# Patient Record
Sex: Female | Born: 1975 | Race: White | Hispanic: No | Marital: Single | Smoking: Never smoker
Health system: Southern US, Community
[De-identification: ages and names within clinical notes are randomized; demographics above are authoritative.]

## PROBLEM LIST (undated history)

## (undated) DIAGNOSIS — R51 Headache: Secondary | ICD-10-CM

## (undated) DIAGNOSIS — R519 Headache, unspecified: Secondary | ICD-10-CM

## (undated) DIAGNOSIS — N83209 Unspecified ovarian cyst, unspecified side: Secondary | ICD-10-CM

## (undated) DIAGNOSIS — E039 Hypothyroidism, unspecified: Secondary | ICD-10-CM

## (undated) DIAGNOSIS — Z9889 Other specified postprocedural states: Secondary | ICD-10-CM

## (undated) DIAGNOSIS — M349 Systemic sclerosis, unspecified: Secondary | ICD-10-CM

## (undated) DIAGNOSIS — Z87442 Personal history of urinary calculi: Secondary | ICD-10-CM

## (undated) HISTORY — DX: Unspecified ovarian cyst, unspecified side: N83.209

## (undated) HISTORY — PX: BREAST BIOPSY: SHX20

## (undated) HISTORY — PX: KIDNEY STONE SURGERY: SHX686

## (undated) HISTORY — PX: DIAGNOSTIC LAPAROSCOPY: SUR761

---

## 2008-09-22 ENCOUNTER — Encounter: Payer: Self-pay | Admitting: Internal Medicine

## 2008-10-12 ENCOUNTER — Encounter: Payer: Self-pay | Admitting: Internal Medicine

## 2008-11-12 ENCOUNTER — Encounter: Payer: Self-pay | Admitting: Internal Medicine

## 2009-09-15 ENCOUNTER — Ambulatory Visit: Payer: Self-pay | Admitting: Internal Medicine

## 2010-01-03 ENCOUNTER — Ambulatory Visit: Payer: Self-pay | Admitting: Internal Medicine

## 2010-10-03 ENCOUNTER — Ambulatory Visit: Payer: Self-pay | Admitting: Internal Medicine

## 2011-11-08 ENCOUNTER — Ambulatory Visit: Payer: Self-pay | Admitting: Internal Medicine

## 2013-02-26 ENCOUNTER — Ambulatory Visit: Payer: Self-pay | Admitting: Internal Medicine

## 2013-02-26 DIAGNOSIS — R0602 Shortness of breath: Secondary | ICD-10-CM

## 2014-11-12 HISTORY — PX: POLYPECTOMY: SHX149

## 2018-09-27 ENCOUNTER — Emergency Department: Payer: BC Managed Care – PPO

## 2018-09-27 ENCOUNTER — Other Ambulatory Visit: Payer: Self-pay

## 2018-09-27 ENCOUNTER — Emergency Department
Admission: EM | Admit: 2018-09-27 | Discharge: 2018-09-27 | Disposition: A | Discharge status: Home / Self Care | Payer: BC Managed Care – PPO | Attending: Emergency Medicine | Admitting: Emergency Medicine

## 2018-09-27 DIAGNOSIS — S92001A Unspecified fracture of right calcaneus, initial encounter for closed fracture: Secondary | ICD-10-CM | POA: Insufficient documentation

## 2018-09-27 DIAGNOSIS — Z23 Encounter for immunization: Secondary | ICD-10-CM | POA: Insufficient documentation

## 2018-09-27 DIAGNOSIS — Y92410 Unspecified street and highway as the place of occurrence of the external cause: Secondary | ICD-10-CM | POA: Diagnosis not present

## 2018-09-27 DIAGNOSIS — Y999 Unspecified external cause status: Secondary | ICD-10-CM | POA: Diagnosis not present

## 2018-09-27 DIAGNOSIS — S51012A Laceration without foreign body of left elbow, initial encounter: Secondary | ICD-10-CM | POA: Diagnosis present

## 2018-09-27 DIAGNOSIS — Y9389 Activity, other specified: Secondary | ICD-10-CM | POA: Insufficient documentation

## 2018-09-27 MED ORDER — OXYCODONE-ACETAMINOPHEN 5-325 MG PO TABS
1.0000 | ORAL_TABLET | Freq: Once | ORAL | Status: AC
  Administered 2018-09-27: 1 via ORAL
  Filled 2018-09-27: qty 1

## 2018-09-27 MED ORDER — FENTANYL CITRATE (PF) 100 MCG/2ML IJ SOLN
50.0000 ug | Freq: Once | INTRAMUSCULAR | Status: AC
  Administered 2018-09-27: 50 ug via INTRAVENOUS

## 2018-09-27 MED ORDER — TETANUS-DIPHTH-ACELL PERTUSSIS 5-2.5-18.5 LF-MCG/0.5 IM SUSP
0.5000 mL | Freq: Once | INTRAMUSCULAR | Status: AC
  Administered 2018-09-27: 0.5 mL via INTRAMUSCULAR
  Filled 2018-09-27: qty 0.5

## 2018-09-27 MED ORDER — FENTANYL CITRATE (PF) 100 MCG/2ML IJ SOLN
INTRAMUSCULAR | Status: AC
  Administered 2018-09-27: 50 ug via INTRAVENOUS
  Filled 2018-09-27: qty 2

## 2018-09-27 MED ORDER — FENTANYL CITRATE (PF) 100 MCG/2ML IJ SOLN: 50.0000 ug | Freq: Once | INTRAMUSCULAR | Status: DC

## 2018-09-27 MED ORDER — LIDOCAINE-EPINEPHRINE 2 %-1:100000 IJ SOLN
INTRAMUSCULAR | Status: AC
  Administered 2018-09-27: 20 mL
  Filled 2018-09-27: qty 1

## 2018-09-27 MED ORDER — LIDOCAINE-EPINEPHRINE 2 %-1:100000 IJ SOLN
20.0000 mL | Freq: Once | INTRAMUSCULAR | Status: AC
  Administered 2018-09-27: 20 mL

## 2018-09-27 MED ORDER — OXYCODONE-ACETAMINOPHEN 5-325 MG PO TABS: 1.0000 | ORAL_TABLET | ORAL | 0 refills | Status: DC | PRN
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
## 2018-09-30 ENCOUNTER — Other Ambulatory Visit: Payer: Self-pay | Admitting: Podiatry

## 2018-09-30 DIAGNOSIS — S92011A Displaced fracture of body of right calcaneus, initial encounter for closed fracture: Secondary | ICD-10-CM

## 2018-10-02 ENCOUNTER — Ambulatory Visit
Admission: RE | Admit: 2018-10-02 | Discharge: 2018-10-02 | Disposition: A | Discharge status: Home / Self Care | Payer: BC Managed Care – PPO | Source: Ambulatory Visit | Attending: Podiatry | Admitting: Podiatry

## 2018-10-02 DIAGNOSIS — X58XXXA Exposure to other specified factors, initial encounter: Secondary | ICD-10-CM | POA: Insufficient documentation

## 2018-10-02 DIAGNOSIS — S92254A Nondisplaced fracture of navicular [scaphoid] of right foot, initial encounter for closed fracture: Secondary | ICD-10-CM | POA: Diagnosis not present

## 2018-10-02 DIAGNOSIS — S92151A Displaced avulsion fracture (chip fracture) of right talus, initial encounter for closed fracture: Secondary | ICD-10-CM | POA: Insufficient documentation

## 2018-10-02 DIAGNOSIS — S92011A Displaced fracture of body of right calcaneus, initial encounter for closed fracture: Secondary | ICD-10-CM | POA: Diagnosis present

## 2018-10-06 ENCOUNTER — Other Ambulatory Visit: Payer: Self-pay | Admitting: Podiatry

## 2018-10-08 ENCOUNTER — Encounter
Admission: RE | Admit: 2018-10-08 | Discharge: 2018-10-08 | Disposition: A | Discharge status: Home / Self Care | Payer: BC Managed Care – PPO | Source: Ambulatory Visit | Attending: Podiatry | Admitting: Podiatry

## 2018-10-08 ENCOUNTER — Other Ambulatory Visit: Payer: Self-pay

## 2018-10-08 HISTORY — DX: Headache: R51

## 2018-10-08 HISTORY — DX: Personal history of urinary calculi: Z87.442

## 2018-10-08 HISTORY — DX: Hypothyroidism, unspecified: E03.9

## 2018-10-08 HISTORY — DX: Headache, unspecified: R51.9

## 2018-10-08 NOTE — Patient Instructions (Addendum)
[facility]: [facility] 2nd floor medical mall [facility](Medical Mall Entrance-take elevator on left to 2nd floor.  Check in with surgery information desk.) @ 6 AM PER PT   Remember: Instructions that are not followed completely may result in serious medical risk, up to and including death, or upon the discretion of your surgeon and anesthesiologist your surgery may need to be rescheduled.    _x___ 1. Do not eat food after midnight the night before your procedure. You may drink clear liquids up to 2 hours before you are scheduled to arrive at the hospital for your procedure.  Do not drink clear liquids within 2 hours of your scheduled arrival to the hospital.  Clear liquids include  --Water or Apple juice without pulp  --Clear carbohydrate beverage such as ClearFast or Gatorade  --Black Coffee or Clear Tea (No milk, no creamers, do not add anything to the coffee or Tea   ____Ensure clear carbohydrate drink on the way to the hospital for bariatric patients  ____Ensure clear carbohydrate drink 3 hours before surgery for Dr Rutherford NailByrnett's patients if physician instructed.   No gum chewing or hard candies.     __x__ 2. No Alcohol for 24 hours before or after surgery.   __x__3. No Smoking or e-cigarettes for 24 prior to surgery.  Do not use any chewable tobacco products for at least 6 hour prior to surgery   ____  4. Bring all medications with you on the day of surgery if instructed.    __x__ 5. Notify your doctor if there is any change in your medical condition     (cold, fever, infections).    x___6. On the morning of surgery brush your teeth with toothpaste and water.  You may rinse your mouth with mouth wash if you wish.  Do not swallow any toothpaste or mouthwash.   Do not wear jewelry, make-up, hairpins, clips or nail polish.  Do not wear lotions, powders, or perfumes. You may wear deodorant.  Do not shave 48 hours prior to surgery. Men may shave  face and neck.  Do not bring valuables to the hospital.    [facility] not responsible for any belongings or valuables.               Contacts, dentures or bridgework may not be worn into surgery.  Leave your suitcase in the car. After surgery it may be brought to your room.  For patients admitted to the hospital, discharge time is determined by your treatment team.  _  Patients discharged the day of surgery will not be allowed to drive home.  You will need someone to drive you home and stay with you the night of your procedure.    Please read over the following fact sheets that you were given:   [facility]    _x___ TAKE THE FOLLOWING MEDICATION THE MORNING OF SURGERY WITH A SMALL SIP OF WATER. These include:  1. LEVOTHYROXINE  2. YOU MAY TAKE PERCOCET DAY OF SURGERY IF NEEDED WITH A SMALL SIP OF WATER  3.  4.  5.  6.  ____Fleets enema or Magnesium Citrate as directed.   ____ Use CHG Soap or sage wipes as directed on instruction sheet   ____ Use inhalers on the day of surgery and bring to hospital day of surgery  ____ Stop Metformin and Janumet 2 days prior to surgery.    ____ Take 1/2 of  usual insulin dose the night before surgery and none on the morning surgery.   ____ Follow recommendations from Cardiologist, Pulmonologist or PCP regarding stopping Aspirin, Coumadin, Plavix ,Eliquis, Effient, or Pradaxa, and Pletal.  X____Stop Anti-inflammatories such as Advil, Aleve, Ibuprofen, Motrin, Naproxen, Naprosyn, Goodies powders or aspirin products NOW-OK to take Tylenol    ____ Stop supplements until after surgery.    ____ Bring C-Pap to the hospital.

## 2018-10-09 MED ORDER — CEFAZOLIN SODIUM-DEXTROSE 2-4 GM/100ML-% IV SOLN
2.0000 g | INTRAVENOUS | Status: AC
  Administered 2018-10-10: 2 g via INTRAVENOUS

## 2018-10-10 ENCOUNTER — Ambulatory Visit: Payer: BC Managed Care – PPO | Admitting: Anesthesiology

## 2018-10-10 ENCOUNTER — Other Ambulatory Visit: Payer: Self-pay

## 2018-10-10 ENCOUNTER — Observation Stay
Admission: RE | Admit: 2018-10-10 | Discharge: 2018-10-11 | Disposition: A | Discharge status: Home / Self Care | Payer: BC Managed Care – PPO | Source: Ambulatory Visit | Attending: Podiatry | Admitting: Podiatry

## 2018-10-10 ENCOUNTER — Encounter: Payer: Self-pay | Admitting: *Deleted

## 2018-10-10 ENCOUNTER — Ambulatory Visit: Payer: BC Managed Care – PPO

## 2018-10-10 ENCOUNTER — Encounter
Admission: RE | Disposition: A | Discharge status: Home / Self Care | Payer: Self-pay | Source: Ambulatory Visit | Attending: Podiatry

## 2018-10-10 DIAGNOSIS — S92001A Unspecified fracture of right calcaneus, initial encounter for closed fracture: Secondary | ICD-10-CM | POA: Diagnosis not present

## 2018-10-10 DIAGNOSIS — Z419 Encounter for procedure for purposes other than remedying health state, unspecified: Secondary | ICD-10-CM

## 2018-10-10 DIAGNOSIS — Z79890 Hormone replacement therapy (postmenopausal): Secondary | ICD-10-CM | POA: Diagnosis not present

## 2018-10-10 DIAGNOSIS — Z885 Allergy status to narcotic agent status: Secondary | ICD-10-CM | POA: Insufficient documentation

## 2018-10-10 DIAGNOSIS — E039 Hypothyroidism, unspecified: Secondary | ICD-10-CM | POA: Diagnosis not present

## 2018-10-10 HISTORY — PX: ORIF CALCANEOUS FRACTURE: SHX5030

## 2018-10-10 LAB — CBC
HCT: 39.4 % (ref 36.0–46.0)
Hemoglobin: 12.9 g/dL (ref 12.0–15.0)
MCH: 28.5 pg (ref 26.0–34.0)
MCHC: 32.7 g/dL (ref 30.0–36.0)
MCV: 87 fL (ref 80.0–100.0)
Platelets: 334 10*3/uL (ref 150–400)
RBC: 4.53 MIL/uL (ref 3.87–5.11)
RDW: 12 % (ref 11.5–15.5)
WBC: 11 10*3/uL — ABNORMAL HIGH (ref 4.0–10.5)
nRBC: 0 % (ref 0.0–0.2)

## 2018-10-10 LAB — CREATININE, SERUM
Creatinine, Ser: 0.68 mg/dL (ref 0.44–1.00)
GFR calc Af Amer: >60 mL/min (ref >60)
GFR calc non Af Amer: >60 mL/min (ref >60)

## 2018-10-10 LAB — POCT PREGNANCY, URINE: Preg Test, Ur: NEGATIVE

## 2018-10-10 SURGERY — OPEN REDUCTION INTERNAL FIXATION (ORIF) CALCANEOUS FRACTURE
Anesthesia: General | Laterality: Right

## 2018-10-10 MED ORDER — FENTANYL CITRATE (PF) 100 MCG/2ML IJ SOLN
INTRAMUSCULAR | Status: AC
  Filled 2018-10-10: qty 2

## 2018-10-10 MED ORDER — CEFAZOLIN SODIUM-DEXTROSE 2-4 GM/100ML-% IV SOLN
INTRAVENOUS | Status: AC
  Filled 2018-10-10: qty 100

## 2018-10-10 MED ORDER — MORPHINE SULFATE (PF) 2 MG/ML IV SOLN: 2.0000 mg | INTRAVENOUS | Status: DC | PRN

## 2018-10-10 MED ORDER — ACETAMINOPHEN 10 MG/ML IV SOLN
INTRAVENOUS | Status: AC
  Filled 2018-10-10: qty 100

## 2018-10-10 MED ORDER — LIDOCAINE HCL (PF) 1 % IJ SOLN
INTRAMUSCULAR | Status: DC | PRN
  Administered 2018-10-10: 1 mL via INTRADERMAL

## 2018-10-10 MED ORDER — MIDAZOLAM HCL 2 MG/2ML IJ SOLN
INTRAMUSCULAR | Status: AC
  Administered 2018-10-10: 2 mg via INTRAVENOUS
  Filled 2018-10-10: qty 2

## 2018-10-10 MED ORDER — OXYCODONE-ACETAMINOPHEN 5-325 MG PO TABS
1.0000 | ORAL_TABLET | Freq: Four times a day (QID) | ORAL | Status: DC | PRN
  Administered 2018-10-10: 2 via ORAL
  Administered 2018-10-10: 1 via ORAL
  Administered 2018-10-11: 2 via ORAL
  Filled 2018-10-10: qty 2
  Filled 2018-10-10: qty 1
  Filled 2018-10-10: qty 2

## 2018-10-10 MED ORDER — BUPIVACAINE HCL (PF) 0.5 % IJ SOLN
INTRAMUSCULAR | Status: AC
  Filled 2018-10-10: qty 30

## 2018-10-10 MED ORDER — ONDANSETRON HCL 4 MG/2ML IJ SOLN
INTRAMUSCULAR | Status: AC
  Filled 2018-10-10: qty 2

## 2018-10-10 MED ORDER — ROPIVACAINE HCL 5 MG/ML IJ SOLN
INTRAMUSCULAR | Status: AC
  Filled 2018-10-10: qty 30

## 2018-10-10 MED ORDER — LIDOCAINE HCL (PF) 1 % IJ SOLN
INTRAMUSCULAR | Status: AC
  Filled 2018-10-10: qty 30

## 2018-10-10 MED ORDER — ONDANSETRON HCL 4 MG/2ML IJ SOLN
INTRAMUSCULAR | Status: DC | PRN
  Administered 2018-10-10: 4 mg via INTRAVENOUS

## 2018-10-10 MED ORDER — OXYCODONE HCL 5 MG PO TABS: 5.0000 mg | ORAL_TABLET | Freq: Once | ORAL | Status: DC | PRN

## 2018-10-10 MED ORDER — DEXAMETHASONE SODIUM PHOSPHATE 10 MG/ML IJ SOLN
INTRAMUSCULAR | Status: AC
  Filled 2018-10-10: qty 1

## 2018-10-10 MED ORDER — FENTANYL CITRATE (PF) 100 MCG/2ML IJ SOLN
50.0000 ug | Freq: Once | INTRAMUSCULAR | Status: AC
  Administered 2018-10-10: 50 ug via INTRAVENOUS

## 2018-10-10 MED ORDER — BUPIVACAINE LIPOSOME 1.3 % IJ SUSP
INTRAMUSCULAR | Status: AC
  Filled 2018-10-10: qty 20

## 2018-10-10 MED ORDER — DEXAMETHASONE SODIUM PHOSPHATE 10 MG/ML IJ SOLN
INTRAMUSCULAR | Status: DC | PRN
  Administered 2018-10-10: 5 mg via INTRAVENOUS

## 2018-10-10 MED ORDER — OXYCODONE-ACETAMINOPHEN 5-325 MG PO TABS: 1.0000 | ORAL_TABLET | Freq: Four times a day (QID) | ORAL | 0 refills | Status: DC | PRN

## 2018-10-10 MED ORDER — FENTANYL CITRATE (PF) 100 MCG/2ML IJ SOLN
INTRAMUSCULAR | Status: DC | PRN
  Administered 2018-10-10: 50 ug via INTRAVENOUS
  Administered 2018-10-10: 100 ug via INTRAVENOUS
  Administered 2018-10-10 (×3): 50 ug via INTRAVENOUS

## 2018-10-10 MED ORDER — ACETAMINOPHEN 10 MG/ML IV SOLN
INTRAVENOUS | Status: DC | PRN
  Administered 2018-10-10: 1000 mg via INTRAVENOUS

## 2018-10-10 MED ORDER — FENTANYL CITRATE (PF) 100 MCG/2ML IJ SOLN
INTRAMUSCULAR | Status: AC
  Administered 2018-10-10: 50 ug via INTRAVENOUS
  Filled 2018-10-10: qty 2

## 2018-10-10 MED ORDER — LIDOCAINE HCL (PF) 1 % IJ SOLN
INTRAMUSCULAR | Status: AC
  Filled 2018-10-10: qty 5

## 2018-10-10 MED ORDER — BUPIVACAINE HCL (PF) 0.25 % IJ SOLN
INTRAMUSCULAR | Status: DC | PRN
  Administered 2018-10-10: 20 mL

## 2018-10-10 MED ORDER — FENTANYL CITRATE (PF) 100 MCG/2ML IJ SOLN
INTRAMUSCULAR | Status: AC
  Administered 2018-10-10: 25 ug via INTRAVENOUS
  Filled 2018-10-10: qty 2

## 2018-10-10 MED ORDER — LACTATED RINGERS IV SOLN
INTRAVENOUS | Status: DC
  Administered 2018-10-10 (×2): via INTRAVENOUS

## 2018-10-10 MED ORDER — BUPIVACAINE HCL 0.25 % IJ SOLN
INTRAMUSCULAR | Status: DC | PRN
  Administered 2018-10-10: 15 mL

## 2018-10-10 MED ORDER — PHENYLEPHRINE HCL 10 MG/ML IJ SOLN
INTRAMUSCULAR | Status: AC
  Filled 2018-10-10: qty 1

## 2018-10-10 MED ORDER — BUPIVACAINE HCL (PF) 0.25 % IJ SOLN
INTRAMUSCULAR | Status: AC
  Filled 2018-10-10: qty 30

## 2018-10-10 MED ORDER — LIDOCAINE HCL (PF) 2 % IJ SOLN
INTRAMUSCULAR | Status: AC
  Filled 2018-10-10: qty 10

## 2018-10-10 MED ORDER — PROPOFOL 10 MG/ML IV BOLUS
INTRAVENOUS | Status: AC
  Filled 2018-10-10: qty 40

## 2018-10-10 MED ORDER — KETOROLAC TROMETHAMINE 30 MG/ML IJ SOLN
INTRAMUSCULAR | Status: AC
  Filled 2018-10-10: qty 1

## 2018-10-10 MED ORDER — MIDAZOLAM HCL 2 MG/2ML IJ SOLN
2.0000 mg | Freq: Once | INTRAMUSCULAR | Status: AC
  Administered 2018-10-10: 2 mg via INTRAVENOUS

## 2018-10-10 MED ORDER — OXYCODONE HCL 5 MG/5ML PO SOLN: 5.0000 mg | Freq: Once | ORAL | Status: DC | PRN

## 2018-10-10 MED ORDER — ENOXAPARIN SODIUM 40 MG/0.4ML ~~LOC~~ SOLN
40.0000 mg | SUBCUTANEOUS | Status: DC
  Administered 2018-10-10: 40 mg via SUBCUTANEOUS
  Filled 2018-10-10: qty 0.4

## 2018-10-10 MED ORDER — ROCURONIUM BROMIDE 100 MG/10ML IV SOLN
INTRAVENOUS | Status: DC | PRN
  Administered 2018-10-10: 50 mg via INTRAVENOUS

## 2018-10-10 MED ORDER — ONDANSETRON HCL 4 MG/2ML IJ SOLN: 4.0000 mg | Freq: Four times a day (QID) | INTRAMUSCULAR | Status: DC | PRN

## 2018-10-10 MED ORDER — SUGAMMADEX SODIUM 200 MG/2ML IV SOLN
INTRAVENOUS | Status: DC | PRN
  Administered 2018-10-10: 200 mg via INTRAVENOUS

## 2018-10-10 MED ORDER — NEOMYCIN-POLYMYXIN B GU 40-200000 IR SOLN
Status: DC | PRN
  Administered 2018-10-10: 6 mL

## 2018-10-10 MED ORDER — BUPIVACAINE LIPOSOME 1.3 % IJ SUSP
INTRAMUSCULAR | Status: DC | PRN
  Administered 2018-10-10: 15 mL

## 2018-10-10 MED ORDER — FAMOTIDINE 20 MG PO TABS
ORAL_TABLET | ORAL | Status: AC
  Administered 2018-10-10: 20 mg via ORAL
  Filled 2018-10-10: qty 1

## 2018-10-10 MED ORDER — LEVOTHYROXINE SODIUM 50 MCG PO TABS
50.0000 ug | ORAL_TABLET | Freq: Every day | ORAL | Status: DC
  Administered 2018-10-11: 50 ug via ORAL
  Filled 2018-10-10: qty 1

## 2018-10-10 MED ORDER — ROPIVACAINE HCL 5 MG/ML IJ SOLN
INTRAMUSCULAR | Status: DC | PRN
  Administered 2018-10-10: 10 mL via PERINEURAL
  Administered 2018-10-10: 20 mL via PERINEURAL

## 2018-10-10 MED ORDER — FENTANYL CITRATE (PF) 100 MCG/2ML IJ SOLN
25.0000 ug | INTRAMUSCULAR | Status: DC | PRN
  Administered 2018-10-10 (×3): 25 ug via INTRAVENOUS

## 2018-10-10 MED ORDER — PROPOFOL 10 MG/ML IV BOLUS
INTRAVENOUS | Status: DC | PRN
  Administered 2018-10-10: 160 mg via INTRAVENOUS

## 2018-10-10 MED ORDER — FAMOTIDINE 20 MG PO TABS
20.0000 mg | ORAL_TABLET | Freq: Once | ORAL | Status: AC
  Administered 2018-10-10: 20 mg via ORAL

## 2018-10-10 MED ORDER — MIDAZOLAM HCL 2 MG/2ML IJ SOLN
INTRAMUSCULAR | Status: AC
  Filled 2018-10-10: qty 2

## 2018-10-10 MED ORDER — SODIUM CHLORIDE (PF) 0.9 % IJ SOLN
INTRAMUSCULAR | Status: AC
  Filled 2018-10-10: qty 10

## 2018-10-10 MED ORDER — BUPIVACAINE HCL (PF) 0.5 % IJ SOLN
INTRAMUSCULAR | Status: AC
  Filled 2018-10-10: qty 10

## 2018-10-10 MED ORDER — POVIDONE-IODINE 7.5 % EX SOLN: Freq: Once | CUTANEOUS | Status: DC

## 2018-10-10 MED ORDER — ONDANSETRON HCL 4 MG PO TABS: 4.0000 mg | ORAL_TABLET | Freq: Four times a day (QID) | ORAL | Status: DC | PRN

## 2018-10-10 MED ORDER — LIDOCAINE HCL (CARDIAC) PF 100 MG/5ML IV SOSY
PREFILLED_SYRINGE | INTRAVENOUS | Status: DC | PRN
  Administered 2018-10-10: 100 mg via INTRAVENOUS

## 2018-10-10 MED ORDER — NEOMYCIN-POLYMYXIN B GU 40-200000 IR SOLN
Status: AC
  Filled 2018-10-10: qty 1

## 2018-10-10 SURGICAL SUPPLY — 54 items
BANDAGE ELASTIC 4 LF NS (GAUZE/BANDAGES/DRESSINGS) ×2 IMPLANT
BIT DRILL 2.5X2.75 QC CALB (BIT) ×2 IMPLANT
BIT DRILL 2.9 CANN QC NONSTRL (BIT) ×2 IMPLANT
BNDG COHESIVE 4X5 TAN STRL (GAUZE/BANDAGES/DRESSINGS) ×4 IMPLANT
BNDG CONFORM 2 STRL LF (GAUZE/BANDAGES/DRESSINGS) ×2 IMPLANT
BNDG CONFORM 3 STRL LF (GAUZE/BANDAGES/DRESSINGS) ×2 IMPLANT
BNDG ESMARK 4X12 TAN STRL LF (GAUZE/BANDAGES/DRESSINGS) ×2 IMPLANT
BNDG GAUZE 4.5X4.1 6PLY STRL (MISCELLANEOUS) ×2 IMPLANT
BONE CANC CHIPS 40CC CAN1/2 (Bone Implant) ×2 IMPLANT
CANISTER SUCT 1200ML W/VALVE (MISCELLANEOUS) ×2 IMPLANT
CHIPS CANC BONE 40CC CAN1/2 (Bone Implant) ×1 IMPLANT
COVER WAND RF STERILE (DRAPES) ×2 IMPLANT
CUFF TOURN 30 STER DUAL PORT (MISCELLANEOUS) ×2 IMPLANT
DRAPE C-ARM XRAY 36X54 (DRAPES) ×2 IMPLANT
DURAPREP 26ML APPLICATOR (WOUND CARE) ×2 IMPLANT
ELECT REM PT RETURN 9FT ADLT (ELECTROSURGICAL) ×2
ELECTRODE REM PT RTRN 9FT ADLT (ELECTROSURGICAL) ×1 IMPLANT
GAUZE PETRO XEROFOAM 1X8 (MISCELLANEOUS) ×4 IMPLANT
GAUZE SPONGE 4X4 12PLY STRL (GAUZE/BANDAGES/DRESSINGS) ×2 IMPLANT
GLOVE BIO SURGEON STRL SZ7.5 (GLOVE) ×2 IMPLANT
GLOVE INDICATOR 8.0 STRL GRN (GLOVE) ×2 IMPLANT
GOWN STRL REUS W/ TWL LRG LVL3 (GOWN DISPOSABLE) ×2 IMPLANT
GOWN STRL REUS W/TWL LRG LVL3 (GOWN DISPOSABLE) ×2
K-WIRE ACE 1.6X6 (WIRE) ×2
KIT TURNOVER KIT A (KITS) ×2 IMPLANT
KWIRE ACE 1.6X6 (WIRE) ×1 IMPLANT
NEEDLE FILTER BLUNT 18X 1/2SAF (NEEDLE) ×1
NEEDLE FILTER BLUNT 18X1 1/2 (NEEDLE) ×1 IMPLANT
NEEDLE HYPO 25X1 1.5 SAFETY (NEEDLE) ×4 IMPLANT
NS IRRIG 500ML POUR BTL (IV SOLUTION) ×2 IMPLANT
PACK EXTREMITY ARMC (MISCELLANEOUS) ×2 IMPLANT
PENCIL ELECTRO HAND CTR (MISCELLANEOUS) ×2 IMPLANT
PLATE ACE PERIMETER (Plate) ×2 IMPLANT
PUTTY DBX 1CC (Putty) ×2 IMPLANT
PUTTY DBX 1CC DEPUY (Putty) ×1 IMPLANT
SCREW ACE CAN 4.0 34M (Screw) ×2 IMPLANT
SCREW CANN ACE 4.0X32 (Screw) ×2 IMPLANT
SCREW CORTICAL 3.5MM  28MM (Screw) ×2 IMPLANT
SCREW CORTICAL 3.5MM  34MM (Screw) ×2 IMPLANT
SCREW CORTICAL 3.5MM 26MM (Screw) ×2 IMPLANT
SCREW CORTICAL 3.5MM 28MM (Screw) ×2 IMPLANT
SCREW CORTICAL 3.5MM 34MM (Screw) ×2 IMPLANT
SPLINT CAST 1 STEP 4X30 (MISCELLANEOUS) ×2 IMPLANT
SPLINT FAST PLASTER 5X30 (CAST SUPPLIES) ×1
SPLINT PLASTER CAST FAST 5X30 (CAST SUPPLIES) ×1 IMPLANT
STOCKINETTE M/LG 89821 (MISCELLANEOUS) ×2 IMPLANT
STRIP CLOSURE SKIN 1/4X4 (GAUZE/BANDAGES/DRESSINGS) ×2 IMPLANT
SUT ETHILON 3-0 FS-10 30 BLK (SUTURE) ×2
SUT VIC AB 2-0 CT1 27 (SUTURE) ×2
SUT VIC AB 2-0 CT1 TAPERPNT 27 (SUTURE) ×2 IMPLANT
SUT VIC AB 4-0 FS2 27 (SUTURE) ×2 IMPLANT
SUT VICRYL AB 3-0 FS1 BRD 27IN (SUTURE) ×2 IMPLANT
SUTURE EHLN 3-0 FS-10 30 BLK (SUTURE) ×1 IMPLANT
SYR 10ML LL (SYRINGE) ×6 IMPLANT

## 2018-10-10 NOTE — Progress Notes (Signed)
Pt AOx4. Resting quietly in the room. Pt denies pain at this time. Ice pack on ankle and elevated on pillow. IV Saline locked. Pt on room air. No other complaints at this time.

## 2018-10-10 NOTE — Anesthesia Postprocedure Evaluation (Signed)
Anesthesia Post Note  Patient: Dana Figueroa  Procedure(s) Performed: OPEN REDUCTION INTERNAL FIXATION (ORIF) CALCANEOUS FRACTURE (Right )  Patient location during evaluation: PACU Anesthesia Type: General Level of consciousness: awake and alert Pain management: pain level controlled Vital Signs Assessment: post-procedure vital signs reviewed and stable Respiratory status: spontaneous breathing, nonlabored ventilation, respiratory function stable and patient connected to nasal cannula oxygen Cardiovascular status: blood pressure returned to baseline and stable Postop Assessment: no apparent nausea or vomiting Anesthetic complications: no     Last Vitals:  Vitals:   10/10/18 1227 10/10/18 1438  BP: (!) 145/91 (!) 134/91  Pulse: 95 97  Resp: 17   Temp:    SpO2: 98% 98%    Last Pain:  Vitals:   10/10/18 1228  TempSrc:   PainSc: 0-No pain                 Cleda MccreedyJoseph K Tamiki Kuba

## 2018-10-10 NOTE — Progress Notes (Signed)
Pt awoke in severe pain in PACU.  Will keep in observation overnight.  May add PCA.  Having popliteal block in pacu now.

## 2018-10-10 NOTE — Anesthesia Procedure Notes (Addendum)
Anesthesia Regional Block: Popliteal block   Pre-Anesthetic Checklist: ,, timeout performed, Correct Patient, Correct Site, Correct Laterality, Correct Procedure, Correct Position, site marked, Risks and benefits discussed,  Surgical consent,  Pre-op evaluation,  At surgeon's request and post-op pain management  Laterality: Lower and Right  Prep: chloraprep       Needles:  Injection technique: Single-shot  Needle Type: Echogenic Needle     Needle Length: 9cm  Needle Gauge: 21     Additional Needles:   Procedures:,,,, ultrasound used (permanent image in chart),,,,  Narrative:  Start time: 10/10/2018 7:15 AM End time: 10/10/2018 7:21 AM Injection made incrementally with aspirations every 5 mL.  Performed by: Personally  Anesthesiologist: Piscitello, Cleda MccreedyJoseph K, MD  Additional Notes: Patient consented for risk of nerve block including but not limited to permament nerve injury, damage to surrounding structures or failed block.  Patient voiced understanding.   Functioning IV was confirmed and monitors were applied.  A echogenic needle was used. Sterile prep,hand hygiene and sterile gloves were used. Minimal sedation used for procedure.   No paresthesia endorsed by patient during the procedure.  Negative aspiration and negative test dose prior to incremental administration of local anesthetic. The patient tolerated the procedure well with no immediate complications.

## 2018-10-10 NOTE — Anesthesia Procedure Notes (Signed)
Procedure Name: Intubation Date/Time: 10/10/2018 7:52 AM Performed by: Clyde Lundborgisser, Oluwadamilola Deliz L, CRNA Pre-anesthesia Checklist: Patient identified, Emergency Drugs available, Suction available and Patient being monitored Patient Re-evaluated:Patient Re-evaluated prior to induction Oxygen Delivery Method: Circle system utilized Preoxygenation: Pre-oxygenation with 100% oxygen Induction Type: IV induction Ventilation: Mask ventilation without difficulty Laryngoscope Size: McGraph and 3 Grade View: Grade II Tube type: Oral Tube size: 7.0 mm Number of attempts: 2 Airway Equipment and Method: Patient positioned with wedge pillow and Stylet Placement Confirmation: ETT inserted through vocal cords under direct vision,  positive ETCO2,  CO2 detector and breath sounds checked- equal and bilateral Secured at: 20 cm Tube secured with: Tape Dental Injury: Teeth and Oropharynx as per pre-operative assessment

## 2018-10-10 NOTE — Anesthesia Procedure Notes (Signed)
Anesthesia Regional Block: Popliteal block   Pre-Anesthetic Checklist: ,, timeout performed, Correct Patient, Correct Site, Correct Laterality, Correct Procedure, Correct Position, site marked, Risks and benefits discussed,  Surgical consent,  Pre-op evaluation,  At surgeon's request and post-op pain management  Laterality: Lower and Right  Prep: chloraprep       Needles:  Injection technique: Single-shot  Needle Type: Echogenic Needle     Needle Length: 9cm  Needle Gauge: 21     Additional Needles:   Procedures:,,,, ultrasound used (permanent image in chart),,,,  Narrative:  Start time: 10/10/2018 11:10 AM End time: 10/10/2018 11:14 AM Injection made incrementally with aspirations every 5 mL.  Performed by: Personally  Anesthesiologist: Piscitello, Cleda MccreedyJoseph K, MD  Additional Notes: Patient endorsing posterior foot pain and requests re block  Functioning IV was confirmed and monitors were applied.  A echogenic needle was used. Sterile prep,hand hygiene and sterile gloves were used. Minimal sedation used for procedure.   No paresthesia endorsed by patient during the procedure.  Negative aspiration and negative test dose prior to incremental administration of local anesthetic. The patient tolerated the procedure well with no immediate complications.

## 2018-10-10 NOTE — Care Management Note (Signed)
Case Management Note  Patient Details  Name: Dana Figueroa MRN: [id number] Date of Birth: [birth year]  Subjective/Objective:                  RNCM spoke with patient regarding transition of care. She has a knee scooter, a wheelchair and crutches available for use at home.  Her parents will rotate assisting her in the home.  She does not not want a bedside commode for home.     Action/Plan: RNCM to follow for discharge needs.   Expected Discharge Date:  10/10/18               Expected Discharge Plan:     In-House Referral:     Discharge planning Services  CM Consult  Post Acute Care Choice:    Choice offered to:     DME Arranged:    DME Agency:     HH Arranged:    HH Agency:     Status of Service:  In process, will continue to follow  If discussed at Long Length of Stay Meetings, dates discussed:    Additional Comments:  Collie Siadngela Arbor Leer, RN 10/10/2018, 3:59 PM

## 2018-10-10 NOTE — Transfer of Care (Signed)
Immediate Anesthesia Transfer of Care Note  Patient: Dana Figueroa  Procedure(s) Performed: OPEN REDUCTION INTERNAL FIXATION (ORIF) CALCANEOUS FRACTURE (Right )  Patient Location: PACU  Anesthesia Type:General  Level of Consciousness: awake, alert  and oriented  Airway & Oxygen Therapy: Patient Spontanous Breathing and Patient connected to face mask oxygen  Post-op Assessment: Report given to RN and Post -op Vital signs reviewed and stable  Post vital signs: Reviewed and stable  Last Vitals:  Vitals Value Taken Time  BP    Temp    Pulse    Resp    SpO2      Last Pain:  Vitals:   10/10/18 0730  TempSrc:   PainSc: 0-No pain         Complications: No apparent anesthesia complications

## 2018-10-10 NOTE — Op Note (Signed)
Operative note   Surgeon:Sybol Morre [employer] forces logistics/support/administrative officer: None    Preop diagnosis: Right joint depression calcaneal fracture    Postop diagnosis: Same    Procedure: ORIF joint depression calcaneal fracture    EBL: Minimal    Anesthesia:regional and general.  Patient underwent preoperative popliteal block    Hemostasis: Thigh tourniquet inflated 250 mmHg for approximately 115 minutes    Specimen: None    Complications: None    Operative indications:Dana Figueroa is an 42 y.o. that presents today for surgical intervention.  The risks/benefits/alternatives/complications have been discussed and consent has been given.    Procedure:  Patient was brought into the OR and placed on the operating table in the lateral decubitus position. After anesthesia was obtained theright lower extremity was prepped and draped in usual sterile fashion.  Attention was directed to the lateral aspect of the calcaneus where after inflation of the tourniquet a lateral extensile incision was placed on the lateral aspect of the calcaneus.  Full-thickness incision was taken down to bone.  Full-thickness flap was then created and the flap was taken up to the level of the subtalar joint.  3 K wires were used for no touch retraction of the skin.  At this time the joint depression calcaneal fracture was noted.  The lateral calcaneal wall was lateral to its normal position.  There was noted to be a large fracture fragment of the posterior facet laterally that had impacted into the calcaneus.  With manipulation I was able to peel this away and this was actually set on the back table for later replacement in its normal position.  At this time further inspection of the subtalar joint revealed a second large posterior facet fracture fragment that had embedded into the calcaneus.  I was able to manipulate this and decompress the calcaneus with the use of periosteal bone elevator.  A lamina spreader was placed into the calcaneus  and talus to continue to disimpact the area and I was able to then realigned this larger more medial posterior facet fragment piece.  At this time this was all stabilized with multiple K wires.  Good realignment of the calcaneus on the axial and lateral view was noted.  The lateral calcaneal wall blowout with the posterior facet piece was then placed back into an anatomically aligned position.  2 K wires were placed from lateral to medial into the calcaneus just inferior to the subtalar joint.  Once again good realignment was noted and at this time two 4.0 mm cannulated screws were placed from lateral to medial to stabilize the posterior facet site.  The lateral calcaneal wall was then placed back into a better anatomic position.  There was noted to be a large defect in the calcaneus from the impaction.  Next a permanent plate from the [employer] was placed on the lateral aspect of the calcaneus.  Multiple screw holes were filled with 3.5 mm solid cortical screws.  Multiple views of fluoroscopy revealed good realignment of the calcaneus.  No residual varus or valgus of the calcaneus on the axial view.  Boehler's angle was reestablished nicely.  The wound was flushed once again with copious amounts of irrigation.  The calcaneal defect was then packed with cancellus bone chips and filled with 1 cc of DBX putty.  The lateral skin flap was then placed back on the calcaneus with the use of 2-0 and 3-0 Vicryl and the skin was reapproximated with 3-0 nylon.  The area was then  infiltrated with a total of 30 cc of a one-to-one mixture of 0.25% bupivacaine and Exparel long-acting anesthetic.  A bulky sterile dressing was applied and patient was placed in a posterior splint with the foot in a neutral position.     Patient tolerated the procedure and anesthesia well.  Was transported from the OR to the PACU with all vital signs stable and vascular status intact. To be discharged per routine protocol.  Will follow up in  approximately 1 week in the outpatient clinic.

## 2018-10-10 NOTE — H&P (Signed)
HISTORY AND PHYSICAL INTERVAL NOTE:  10/10/2018  7:19 AM  Dana Figueroa  has presented today for surgery, with the diagnosis of Right heel fracture.  The various methods of treatment have been discussed with the patient.  No guarantees were given.  After consideration of risks, benefits and other options for treatment, the patient has consented to surgery.  I have reviewed the patients' chart and labs.     A history and physical examination was performed in my office.  The patient was reexamined.  There have been no changes to this history and physical examination.  Dana Figueroa, Dana Figueroa A

## 2018-10-10 NOTE — Anesthesia Post-op Follow-up Note (Signed)
Anesthesia QCDR form completed.        

## 2018-10-10 NOTE — Anesthesia Preprocedure Evaluation (Signed)
Anesthesia Evaluation  Patient identified by MRN, date of birth, ID band Patient awake    Reviewed: Allergy & Precautions, H&P , NPO status , Patient's Chart, lab work & pertinent test results  History of Anesthesia Complications Negative for: history of anesthetic complications  Airway Mallampati: III  TM Distance: <3 FB Neck ROM: full    Dental  (+) Chipped   Pulmonary neg pulmonary ROS, neg shortness of breath,           Cardiovascular Exercise Tolerance: Good (-) angina(-) Past MI and (-) DOE negative cardio ROS       Neuro/Psych  Headaches, negative psych ROS   GI/Hepatic negative GI ROS, Neg liver ROS, neg GERD  ,  Endo/Other  negative endocrine ROSHypothyroidism   Renal/GU      Musculoskeletal   Abdominal   Peds  Hematology negative hematology ROS (+)   Anesthesia Other Findings Past Medical History: No date: Headache     Comment:  migraines No date: History of kidney stones No date: Hypothyroidism  Past Surgical History: No date: DIAGNOSTIC LAPAROSCOPY No date: KIDNEY STONE SURGERY  BMI    Body Mass Index:  32.94 kg/m      Reproductive/Obstetrics negative OB ROS                             Anesthesia Physical Anesthesia Plan  ASA: III  Anesthesia Plan: General ETT   Post-op Pain Management: GA combined w/ Regional for post-op pain   Induction: Intravenous  PONV Risk Score and Plan: Ondansetron, Dexamethasone, Midazolam and Treatment may vary due to age or medical condition  Airway Management Planned: Oral ETT  Additional Equipment:   Intra-op Plan:   Post-operative Plan: Extubation in OR  Informed Consent: I have reviewed the patients History and Physical, chart, labs and discussed the procedure including the risks, benefits and alternatives for the proposed anesthesia with the patient or authorized representative who has indicated his/her understanding  and acceptance.   Dental Advisory Given  Plan Discussed with: Anesthesiologist, CRNA and Surgeon  Anesthesia Plan Comments: (Patient consented for risks of anesthesia including but not limited to:  - adverse reactions to medications - damage to teeth, lips or other oral mucosa - sore throat or hoarseness - Damage to heart, brain, lungs or loss of life  Patient voiced understanding.)        Anesthesia Quick Evaluation

## 2018-10-11 ENCOUNTER — Encounter: Payer: Self-pay | Admitting: Podiatry

## 2018-10-11 DIAGNOSIS — S92001A Unspecified fracture of right calcaneus, initial encounter for closed fracture: Secondary | ICD-10-CM | POA: Diagnosis not present

## 2018-10-11 LAB — HIV ANTIBODY (ROUTINE TESTING W REFLEX): HIV Screen 4th Generation wRfx: NONREACTIVE

## 2018-10-11 MED ORDER — ASPIRIN EC 81 MG PO TBEC: 81.0000 mg | DELAYED_RELEASE_TABLET | Freq: Every day | ORAL | Status: DC

## 2018-10-11 NOTE — Progress Notes (Signed)
S/p calcaneal fx ORIF. Doing very well.  On po pain meds and comfortable. Dressing with bloody drainage.  Changed and incision looks good. OK to d/c home. NWB left foot. F/U has already been made.

## 2018-10-11 NOTE — Progress Notes (Signed)
Patient a&o, resting in bed. Up with PT this am. Call bell in reach. No complaints at this time. Dressing dry and intact. Plan for discharge today pending orders.

## 2018-10-11 NOTE — Discharge Instructions (Signed)
[facility]  POST OPERATIVE INSTRUCTIONS FOR DR. TROXLER AND DR. Genev[facility] PODIATRY DEPARTMENT   1. Take your medication as prescribed.  Pain medication should be taken only as needed.  2. Keep the dressing clean, dry and intact.  3. Keep your foot elevated above the heart level for the first 48 hours.  4. Walking to the bathroom and brief periods of walking are acceptable, unless we have instructed you to be non-weight bearing.  5. Always wear your post-op shoe when walking.  Always use your crutches if you are to be non-weight bearing.  6. Do not take a shower. Baths are permissible as long as the foot is kept out of the water.   7. Every hour you are awake:  - Bend your knee 15 times. - Flex foot 15 times - Massage calf 15 times  8. Call [facility] [phone]) if any of the following problems occur: - You develop a temperature or fever. - The bandage becomes saturated with blood. - Medication does not stop your pain. - Injury of the foot occurs. - Any symptoms of infection including redness, odor, or red streaks running from wound.   Incentive Spirometer An incentive spirometer is a tool that measures how well you are filling your lungs with each breath. This tool can help keep your lungs clear and active. Taking long, deep breaths may help reverse or decrease the chance of developing breathing (pulmonary) problems, especially infection, following:  Surgery of the chest or abdomen.  Surgery if you have a history of smoking or a lung problem.  A long period of time when you are unable to move or be active.  If the spirometer includes an indicator to show your best effort, your health care provider or respiratory therapist will help you set a goal. Keep a log of your progress if directed by your health care provider. What are the risks?  Breathing too quickly may cause dizziness or cause you to pass out. Take  your time so you do not get dizzy or lightheaded.  If you are in pain, you may need to take or ask for pain medicine before doing incentive spirometry. It is harder to take a deep breath if you are having pain. How to use your incentive spirometer 1. Sit on the edge of your bed if possible, or sit up as far as you can in bed or on a chair. 2. Hold the incentive spirometer in an upright position. 3. Breathe out normally. 4. Place the mouthpiece in your mouth and seal your lips tightly around it. 5. Breathe in slowly and as deeply as possible, raising the piston or the ball toward the top of the column. 6. Hold your breath for 3-5 seconds or for as long as possible. Allow the piston or ball to fall to the bottom of the column. 7. Remove the mouthpiece from your mouth and breathe out normally. 8. The spirometer may include an indicator to show your best effort. Use the indicator as a goal to work toward during each repetition. 9. Rest for a few seconds and repeat this at least 10 times, every 1-2 hours when you are awake. Take your time and take a few normal breaths between deep breaths. Breathing too quickly may cause dizziness or cause you to pass out. Take your time so you do not get dizzy or lightheaded. 10. After each set of 10 deep breaths, practice coughing to be sure your lungs  are clear. If you had a surgical cut (incision) made during surgery, support your incision when coughing by placing a pillow or rolled-up towel firmly against it. Once you are able to get out of bed, walk around indoors and cough well. You may stop using the incentive spirometer when instructed by your health care provider. Contact a health care provider if:  You are having difficulty using the spirometer.  You have trouble using the spirometer as often as instructed.  Your pain medicine is not giving enough relief while using the spirometer.  You have a fever.  You develop shortness of breath. Get help right  away if:  You develop a cough with bloody sputum.  You develop worsening pain, redness, or discharge at or near the incision site. This information is not intended to replace advice given to you by your health care provider. Make sure you discuss any questions you have with your health care provider. Document Released: 03/11/2007 Document Revised: 07/23/2016 Document Reviewed: 06/07/2014 Elsevier Interactive Patient Education  [employer]2018 Elsevier Inc.
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
## 2018-10-11 NOTE — Progress Notes (Signed)
Patient discharging home. Instructions given to patient, verbalized understanding. Patient's parents will be taking patient home. Waiting on parents to come.
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
## 2019-08-20 ENCOUNTER — Other Ambulatory Visit: Payer: Self-pay | Admitting: Podiatry

## 2019-08-20 DIAGNOSIS — S92011S Displaced fracture of body of right calcaneus, sequela: Secondary | ICD-10-CM

## 2019-08-27 ENCOUNTER — Ambulatory Visit
Admission: RE | Admit: 2019-08-27 | Discharge: 2019-08-27 | Disposition: A | Discharge status: Home / Self Care | Payer: BC Managed Care – PPO | Source: Ambulatory Visit | Attending: Podiatry | Admitting: Podiatry

## 2019-08-27 ENCOUNTER — Other Ambulatory Visit: Payer: Self-pay

## 2019-08-27 DIAGNOSIS — S92011S Displaced fracture of body of right calcaneus, sequela: Secondary | ICD-10-CM | POA: Diagnosis present

## 2019-09-15 ENCOUNTER — Other Ambulatory Visit: Payer: Self-pay | Admitting: Podiatry

## 2019-09-21 ENCOUNTER — Other Ambulatory Visit: Payer: Self-pay

## 2019-09-21 ENCOUNTER — Encounter
Admission: RE | Admit: 2019-09-21 | Discharge: 2019-09-21 | Disposition: A | Discharge status: Home / Self Care | Payer: BC Managed Care – PPO | Source: Ambulatory Visit | Attending: Podiatry | Admitting: Podiatry

## 2019-09-21 HISTORY — DX: Systemic sclerosis, unspecified: M34.9

## 2019-09-21 NOTE — Patient Instructions (Signed)
Your procedure is scheduled on: Friday 11/13 Report to Day Surgery. To find out your arrival time please call [phone] between 1PM - 3PM on Thurs 11/13.  Remember: Instructions that are not followed completely may result in serious medical risk,  up to and including death, or upon the discretion of your surgeon and anesthesiologist your  surgery may need to be rescheduled.     _X__ 1. Do not eat food after midnight the night before your procedure.                 No gum chewing or hard candies. You may drink clear liquids up to 2 hours                 before you are scheduled to arrive for your surgery- DO not drink clear                 liquids within 2 hours of the start of your surgery.                 Clear Liquids include:  water, apple juice without pulp, clear carbohydrate                 drink such as Clearfast of Gatorade, Black Coffee or Tea (Do not add                 anything to coffee or tea).  __X__2.  On the morning of surgery brush your teeth with toothpaste and water, you                may rinse your mouth with mouthwash if you wish.  Do not swallow any toothpaste of mouthwash.     ___ 3.  No Alcohol for 24 hours before or after surgery.   ___ 4.  Do Not Smoke or use e-cigarettes For 24 Hours Prior to Your Surgery.                 Do not use any chewable tobacco products for at least 6 hours prior to                 surgery.  ____  5.  Bring all medications with you on the day of surgery if instructed.   __x__  6.  Notify your doctor if there is any change in your medical condition      (cold, fever, infections).     Do not wear jewelry, make-up, hairpins, clips or nail polish. Do not wear lotions, powders, or perfumes. You may wear deodorant. Do not shave 48 hours prior to surgery. Men may shave face and neck. Do not bring valuables to the hospital.    [facility] is not responsible for any belongings or valuables.  Contacts,  dentures or bridgework may not be worn into surgery. Leave your suitcase in the car. After surgery it may be brought to your room. For patients admitted to the hospital, discharge time is determined by your treatment team.   Patients discharged the day of surgery will not be allowed to drive home.   Please read over the following fact sheets that you were given:    __x__ Take these medicines the morning of surgery with A SIP OF WATER:    1. levothyroxine (SYNTHROID) 88 MCG tablet  2.   3.   4.  5.  6.  ____ Fleet Enema (as directed)   __x__ Use CHG Soap as directed  ____ Use inhalers  on the day of surgery  ____ Stop metformin 2 days prior to surgery    ____ Take 1/2 of usual insulin dose the night before surgery. No insulin the morning          of surgery.   ____ Stop Coumadin/Plavix/aspirin on   __x__ Stop Anti-inflammatories ibuprofen, aleve, aspirin until after the surgery   ____ Stop supplements until after surgery.    ____ Bring C-Pap to the hospital.

## 2019-09-22 ENCOUNTER — Other Ambulatory Visit
Admission: RE | Admit: 2019-09-22 | Discharge: 2019-09-22 | Disposition: A | Discharge status: Home / Self Care | Payer: BC Managed Care – PPO | Source: Ambulatory Visit | Attending: Podiatry | Admitting: Podiatry

## 2019-09-22 DIAGNOSIS — Z01812 Encounter for preprocedural laboratory examination: Secondary | ICD-10-CM | POA: Insufficient documentation

## 2019-09-22 DIAGNOSIS — Z20828 Contact with and (suspected) exposure to other viral communicable diseases: Secondary | ICD-10-CM | POA: Insufficient documentation

## 2019-09-22 LAB — SARS CORONAVIRUS 2 (TAT 6-24 HRS): SARS Coronavirus 2: NEGATIVE

## 2019-09-25 ENCOUNTER — Encounter: Payer: Self-pay | Admitting: *Deleted

## 2019-09-25 ENCOUNTER — Ambulatory Visit: Payer: BC Managed Care – PPO

## 2019-09-25 ENCOUNTER — Ambulatory Visit: Payer: BC Managed Care – PPO | Admitting: Certified Registered"

## 2019-09-25 ENCOUNTER — Other Ambulatory Visit: Payer: Self-pay

## 2019-09-25 ENCOUNTER — Encounter
Admission: RE | Disposition: A | Discharge status: Home / Self Care | Payer: Self-pay | Source: Home / Self Care | Attending: Podiatry

## 2019-09-25 ENCOUNTER — Ambulatory Visit
Admission: RE | Admit: 2019-09-25 | Discharge: 2019-09-25 | Disposition: A | Discharge status: Home / Self Care | Payer: BC Managed Care – PPO | Attending: Podiatry | Admitting: Podiatry

## 2019-09-25 DIAGNOSIS — Y831 Surgical operation with implant of artificial internal device as the cause of abnormal reaction of the patient, or of later complication, without mention of misadventure at the time of the procedure: Secondary | ICD-10-CM | POA: Diagnosis not present

## 2019-09-25 DIAGNOSIS — T8484XA Pain due to internal orthopedic prosthetic devices, implants and grafts, initial encounter: Secondary | ICD-10-CM | POA: Diagnosis not present

## 2019-09-25 DIAGNOSIS — Z79890 Hormone replacement therapy (postmenopausal): Secondary | ICD-10-CM | POA: Insufficient documentation

## 2019-09-25 DIAGNOSIS — M19171 Post-traumatic osteoarthritis, right ankle and foot: Secondary | ICD-10-CM | POA: Diagnosis present

## 2019-09-25 DIAGNOSIS — E039 Hypothyroidism, unspecified: Secondary | ICD-10-CM | POA: Diagnosis not present

## 2019-09-25 HISTORY — PX: FOOT ARTHRODESIS: SHX1655

## 2019-09-25 LAB — POCT PREGNANCY, URINE: Preg Test, Ur: NEGATIVE

## 2019-09-25 SURGERY — FUSION, JOINT, FOOT
Anesthesia: General | Site: Foot | Laterality: Right

## 2019-09-25 MED ORDER — PROPOFOL 10 MG/ML IV BOLUS
INTRAVENOUS | Status: DC | PRN
  Administered 2019-09-25: 60 mg via INTRAVENOUS
  Administered 2019-09-25: 140 mg via INTRAVENOUS

## 2019-09-25 MED ORDER — FENTANYL CITRATE (PF) 100 MCG/2ML IJ SOLN
INTRAMUSCULAR | Status: AC
  Filled 2019-09-25: qty 2

## 2019-09-25 MED ORDER — CEFAZOLIN SODIUM-DEXTROSE 2-4 GM/100ML-% IV SOLN
2.0000 g | INTRAVENOUS | Status: AC
  Administered 2019-09-25: 08:00:00 2 g via INTRAVENOUS

## 2019-09-25 MED ORDER — LACTATED RINGERS IV SOLN
INTRAVENOUS | Status: DC
  Administered 2019-09-25: 07:00:00 via INTRAVENOUS

## 2019-09-25 MED ORDER — ROPIVACAINE HCL 5 MG/ML IJ SOLN
INTRAMUSCULAR | Status: AC
  Filled 2019-09-25: qty 30

## 2019-09-25 MED ORDER — LIDOCAINE HCL (PF) 1 % IJ SOLN
INTRAMUSCULAR | Status: AC
  Filled 2019-09-25: qty 5

## 2019-09-25 MED ORDER — ONDANSETRON HCL 4 MG/2ML IJ SOLN
INTRAMUSCULAR | Status: AC
  Filled 2019-09-25: qty 2

## 2019-09-25 MED ORDER — OXYCODONE-ACETAMINOPHEN 5-325 MG PO TABS: 1.0000 | ORAL_TABLET | Freq: Four times a day (QID) | ORAL | 0 refills | Status: AC | PRN

## 2019-09-25 MED ORDER — FENTANYL CITRATE (PF) 100 MCG/2ML IJ SOLN: 25.0000 ug | INTRAMUSCULAR | Status: DC | PRN

## 2019-09-25 MED ORDER — FAMOTIDINE 20 MG PO TABS
ORAL_TABLET | ORAL | Status: AC
  Administered 2019-09-25: 20 mg via ORAL
  Filled 2019-09-25: qty 1

## 2019-09-25 MED ORDER — FAMOTIDINE 20 MG PO TABS
20.0000 mg | ORAL_TABLET | Freq: Once | ORAL | Status: AC
  Administered 2019-09-25: 07:00:00 20 mg via ORAL

## 2019-09-25 MED ORDER — LIDOCAINE HCL (PF) 2 % IJ SOLN
INTRAMUSCULAR | Status: AC
  Filled 2019-09-25: qty 10

## 2019-09-25 MED ORDER — MIDAZOLAM HCL 2 MG/2ML IJ SOLN
INTRAMUSCULAR | Status: AC
  Administered 2019-09-25: 1 mg via INTRAVENOUS
  Filled 2019-09-25: qty 2

## 2019-09-25 MED ORDER — FENTANYL CITRATE (PF) 100 MCG/2ML IJ SOLN
INTRAMUSCULAR | Status: DC | PRN
  Administered 2019-09-25 (×2): 25 ug via INTRAVENOUS
  Administered 2019-09-25: 50 ug via INTRAVENOUS

## 2019-09-25 MED ORDER — CEFAZOLIN SODIUM-DEXTROSE 2-4 GM/100ML-% IV SOLN
INTRAVENOUS | Status: AC
  Filled 2019-09-25: qty 100

## 2019-09-25 MED ORDER — FENTANYL CITRATE (PF) 100 MCG/2ML IJ SOLN
50.0000 ug | Freq: Once | INTRAMUSCULAR | Status: AC
  Administered 2019-09-25: 07:00:00 50 ug via INTRAVENOUS

## 2019-09-25 MED ORDER — MIDAZOLAM HCL 2 MG/2ML IJ SOLN
INTRAMUSCULAR | Status: DC | PRN
  Administered 2019-09-25: 2 mg via INTRAVENOUS

## 2019-09-25 MED ORDER — DEXAMETHASONE SODIUM PHOSPHATE 10 MG/ML IJ SOLN
INTRAMUSCULAR | Status: AC
  Filled 2019-09-25: qty 1

## 2019-09-25 MED ORDER — MIDAZOLAM HCL 2 MG/2ML IJ SOLN
INTRAMUSCULAR | Status: AC
  Filled 2019-09-25: qty 2

## 2019-09-25 MED ORDER — LIDOCAINE HCL (CARDIAC) PF 100 MG/5ML IV SOSY
PREFILLED_SYRINGE | INTRAVENOUS | Status: DC | PRN
  Administered 2019-09-25: 80 mg via INTRAVENOUS

## 2019-09-25 MED ORDER — PROMETHAZINE HCL 25 MG/ML IJ SOLN: 6.2500 mg | INTRAMUSCULAR | Status: DC | PRN

## 2019-09-25 MED ORDER — PROPOFOL 10 MG/ML IV BOLUS
INTRAVENOUS | Status: AC
  Filled 2019-09-25: qty 40

## 2019-09-25 MED ORDER — POVIDONE-IODINE 7.5 % EX SOLN
Freq: Once | CUTANEOUS | Status: DC
  Filled 2019-09-25: qty 118

## 2019-09-25 MED ORDER — KETOROLAC TROMETHAMINE 30 MG/ML IJ SOLN
INTRAMUSCULAR | Status: AC
  Filled 2019-09-25: qty 1

## 2019-09-25 MED ORDER — ROPIVACAINE HCL 5 MG/ML IJ SOLN
INTRAMUSCULAR | Status: DC | PRN
  Administered 2019-09-25: 30 mL via PERINEURAL

## 2019-09-25 MED ORDER — MIDAZOLAM HCL 2 MG/2ML IJ SOLN
1.0000 mg | Freq: Once | INTRAMUSCULAR | Status: AC
  Administered 2019-09-25: 07:00:00 1 mg via INTRAVENOUS

## 2019-09-25 MED ORDER — BUPIVACAINE-EPINEPHRINE 0.25% -1:200000 IJ SOLN
INTRAMUSCULAR | Status: DC | PRN
  Administered 2019-09-25: 10 mL

## 2019-09-25 MED ORDER — ONDANSETRON HCL 4 MG/2ML IJ SOLN
INTRAMUSCULAR | Status: DC | PRN
  Administered 2019-09-25: 4 mg via INTRAVENOUS

## 2019-09-25 MED ORDER — LIDOCAINE HCL (PF) 1 % IJ SOLN
INTRAMUSCULAR | Status: DC | PRN
  Administered 2019-09-25: 5 mL via SUBCUTANEOUS

## 2019-09-25 MED ORDER — FENTANYL CITRATE (PF) 100 MCG/2ML IJ SOLN
INTRAMUSCULAR | Status: AC
  Administered 2019-09-25: 50 ug via INTRAVENOUS
  Filled 2019-09-25: qty 2

## 2019-09-25 MED ORDER — DEXAMETHASONE SODIUM PHOSPHATE 10 MG/ML IJ SOLN
INTRAMUSCULAR | Status: DC | PRN
  Administered 2019-09-25: 5 mg via INTRAVENOUS

## 2019-09-25 SURGICAL SUPPLY — 62 items
BASIN GRAD PLASTIC 32OZ STRL (MISCELLANEOUS) ×2 IMPLANT
BIT DRILL 4.8X200 CANN (BIT) ×2 IMPLANT
BIT DRILL QC 1.8X125 (BIT) ×2 IMPLANT
BLADE SURG 15 STRL LF DISP TIS (BLADE) ×5 IMPLANT
BLADE SURG 15 STRL SS (BLADE) ×5
BNDG COHESIVE 4X5 TAN STRL (GAUZE/BANDAGES/DRESSINGS) ×2 IMPLANT
BNDG CONFORM 2 STRL LF (GAUZE/BANDAGES/DRESSINGS) ×2 IMPLANT
BNDG CONFORM 3 STRL LF (GAUZE/BANDAGES/DRESSINGS) ×2 IMPLANT
BNDG ELASTIC 4X5.8 VLCR NS LF (GAUZE/BANDAGES/DRESSINGS) ×4 IMPLANT
BNDG ESMARK 4X12 TAN STRL LF (GAUZE/BANDAGES/DRESSINGS) ×2 IMPLANT
BNDG GAUZE 4.5X4.1 6PLY STRL (MISCELLANEOUS) ×2 IMPLANT
BUR 4X45 EGG (BURR) ×2 IMPLANT
CANISTER SUCT 1200ML W/VALVE (MISCELLANEOUS) ×2 IMPLANT
COVER PIN YLW 0.028-062 (MISCELLANEOUS) ×4 IMPLANT
COVER WAND RF STERILE (DRAPES) ×2 IMPLANT
CUFF TOURN SGL QUICK 18X4 (TOURNIQUET CUFF) IMPLANT
CUFF TOURN SGL QUICK 24 (TOURNIQUET CUFF)
CUFF TRNQT CYL 24X4X16.5-23 (TOURNIQUET CUFF) IMPLANT
DRAPE C-ARM XRAY 36X54 (DRAPES) ×2 IMPLANT
DRAPE C-ARMOR (DRAPES) ×2 IMPLANT
DURAPREP 26ML APPLICATOR (WOUND CARE) ×2 IMPLANT
ELECT CAUTERY BLADE 6.4 (BLADE) ×2 IMPLANT
ELECT REM PT RETURN 9FT ADLT (ELECTROSURGICAL) ×2
ELECTRODE REM PT RTRN 9FT ADLT (ELECTROSURGICAL) ×1 IMPLANT
GAUZE SPONGE 4X4 12PLY STRL (GAUZE/BANDAGES/DRESSINGS) ×2 IMPLANT
GAUZE XEROFORM 1X8 LF (GAUZE/BANDAGES/DRESSINGS) ×2 IMPLANT
GLOVE BIO SURGEON STRL SZ7.5 (GLOVE) ×2 IMPLANT
GLOVE INDICATOR 8.0 STRL GRN (GLOVE) ×2 IMPLANT
GOWN STRL REUS W/ TWL LRG LVL3 (GOWN DISPOSABLE) ×2 IMPLANT
GOWN STRL REUS W/TWL LRG LVL3 (GOWN DISPOSABLE) ×2
GRAFT TRIN ELITE MED MUSC TRAN (Graft) ×2 IMPLANT
K-WIRE 2.0X150M (WIRE) ×4
KIT TURNOVER KIT A (KITS) ×2 IMPLANT
KWIRE 2.0X150M (WIRE) ×2 IMPLANT
NEEDLE FILTER BLUNT 18X 1/2SAF (NEEDLE) ×1
NEEDLE FILTER BLUNT 18X1 1/2 (NEEDLE) ×1 IMPLANT
NEEDLE HYPO 25X1 1.5 SAFETY (NEEDLE) ×4 IMPLANT
NS IRRIG 1000ML POUR BTL (IV SOLUTION) ×2 IMPLANT
PACK EXTREMITY ARMC (MISCELLANEOUS) ×2 IMPLANT
PADDING CAST BLEND 4X4 NS (MISCELLANEOUS) ×2 IMPLANT
PIN GUIDE DRILL TIP 2.8X300 (DRILL) ×4 IMPLANT
SCREW CANN THRD 6.5X70 (Screw) ×4 IMPLANT
SPLINT CAST 1 STEP 4X30 (MISCELLANEOUS) ×2 IMPLANT
SPLINT FAST PLASTER 5X30 (CAST SUPPLIES) ×1
SPLINT PLASTER CAST FAST 5X30 (CAST SUPPLIES) ×1 IMPLANT
SPONGE LAP 18X18 RF (DISPOSABLE) ×2 IMPLANT
STOCKINETTE M/LG 89821 (MISCELLANEOUS) ×2 IMPLANT
STRIP CLOSURE SKIN 1/4X4 (GAUZE/BANDAGES/DRESSINGS) ×2 IMPLANT
SUT ETHILON 3-0 FS-10 30 BLK (SUTURE) ×2
SUT ETHILON 4-0 (SUTURE) ×1
SUT ETHILON 4-0 FS2 18XMFL BLK (SUTURE) ×1
SUT VIC AB 2-0 SH 27 (SUTURE) ×1
SUT VIC AB 2-0 SH 27XBRD (SUTURE) ×1 IMPLANT
SUT VIC AB 3-0 SH 27 (SUTURE) ×1
SUT VIC AB 3-0 SH 27X BRD (SUTURE) ×1 IMPLANT
SUT VIC AB 4-0 FS2 27 (SUTURE) IMPLANT
SUTURE EHLN 3-0 FS-10 30 BLK (SUTURE) ×1 IMPLANT
SUTURE ETHLN 4-0 FS2 18XMF BLK (SUTURE) ×1 IMPLANT
SYR 10ML LL (SYRINGE) ×2 IMPLANT
TOWEL OR 17X26 4PK STRL BLUE (TOWEL DISPOSABLE) ×2 IMPLANT
WIRE Z .062 C-WIRE SPADE TIP (WIRE) ×4 IMPLANT
c-Wire .62" spade ×4 IMPLANT

## 2019-09-25 NOTE — Anesthesia Procedure Notes (Signed)
Procedure Name: LMA Insertion Date/Time: 09/25/2019 7:50 AM Performed by: Chanetta Marshall, CRNA Pre-anesthesia Checklist: Patient identified, Emergency Drugs available, Suction available and Patient being monitored Patient Re-evaluated:Patient Re-evaluated prior to induction Oxygen Delivery Method: Circle system utilized Preoxygenation: Pre-oxygenation with 100% oxygen Induction Type: IV induction Ventilation: Mask ventilation without difficulty LMA: LMA inserted LMA Size: 4.0 Tube type: Oral Number of attempts: 1 Placement Confirmation: positive ETCO2,  breath sounds checked- equal and bilateral and CO2 detector Tube secured with: Tape Dental Injury: Teeth and Oropharynx as per pre-operative assessment

## 2019-09-25 NOTE — Anesthesia Post-op Follow-up Note (Signed)
Anesthesia QCDR form completed.        

## 2019-09-25 NOTE — Transfer of Care (Signed)
Immediate Anesthesia Transfer of Care Note  Patient: Dana Figueroa  Procedure(s) Performed: ARTHRODESIS FOOT - STJ RIGHT (Right Foot)  Patient Location: PACU  Anesthesia Type:General  Level of Consciousness: awake, alert  and oriented  Airway & Oxygen Therapy: Patient Spontanous Breathing and Patient connected to nasal cannula oxygen  Post-op Assessment: Report given to RN and Post -op Vital signs reviewed and stable  Post vital signs: Reviewed and stable  Last Vitals:  Vitals Value Taken Time  BP    Temp    Pulse    Resp    SpO2      Last Pain:  Vitals:   09/25/19 0616  TempSrc: Tympanic  PainSc: 0-No pain         Complications: No apparent anesthesia complications

## 2019-09-25 NOTE — Anesthesia Procedure Notes (Signed)
Anesthesia Regional Block: Popliteal block   Pre-Anesthetic Checklist: ,, timeout performed, Correct Patient, Correct Site, Correct Laterality, Correct Procedure, Correct Position, site marked, Risks and benefits discussed,  Surgical consent,  Pre-op evaluation,  At surgeon's request and post-op pain management  Laterality: Lower and Right  Prep: chloraprep       Needles:  Injection technique: Single-shot  Needle Type: Echogenic Needle     Needle Length: 9cm  Needle Gauge: 21     Additional Needles:   Procedures:,,,, ultrasound used (permanent image in chart),,,,  Narrative:  Start time: 09/25/2019 7:28 AM End time: 09/25/2019 7:31 AM Injection made incrementally with aspirations every 5 mL.  Performed by: Personally  Anesthesiologist: Martha Clan, MD  Additional Notes: Patient consented for risk and benefits of nerve block including but not limited to nerve damage, failed block, bleeding and infection.  Patient voiced understanding.  Functioning IV was confirmed and monitors were applied.  A echogenic needle was used. Sterile prep,hand hygiene and sterile gloves were used. Minimal sedation used for procedure.   No paresthesia endorsed by patient during the procedure.  Negative aspiration and negative test dose prior to incremental administration of local anesthetic. The patient tolerated the procedure well with no immediate complications.

## 2019-09-25 NOTE — H&P (Signed)
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  The various methods of treatment have been discussed with the patient.  No guarantees were given.  After consideration of risks, benefits and other options for treatment, the patient has consented to surgery.  I have reviewed the patients' chart and labs.     Figueroa history and physical examination was performed in my office.  The patient was reexamined.  There have been no changes to this history and physical examination.  Dana Figueroa

## 2019-09-25 NOTE — Anesthesia Postprocedure Evaluation (Signed)
Anesthesia Post Note  Patient: Dana Figueroa  Procedure(s) Performed: ARTHRODESIS FOOT - STJ RIGHT (Right Foot)  Patient location during evaluation: PACU Anesthesia Type: General Level of consciousness: awake and alert Pain management: pain level controlled Vital Signs Assessment: post-procedure vital signs reviewed and stable Respiratory status: spontaneous breathing, nonlabored ventilation, respiratory function stable and patient connected to nasal cannula oxygen Cardiovascular status: blood pressure returned to baseline and stable Postop Assessment: no apparent nausea or vomiting Anesthetic complications: no     Last Vitals:  Vitals:   09/25/19 1153 09/25/19 1216  BP:  137/88  Pulse: 78 78  Resp: 14 18  Temp: 36.6 C (!) 36.1 C  SpO2: 99% 100%    Last Pain:  Vitals:   09/25/19 1216  TempSrc: Tympanic  PainSc: 0-No pain                 Martha Clan

## 2019-09-25 NOTE — Op Note (Signed)
Operative note   Surgeon:Rishik Tubby Lawyer: None    Preop diagnosis: 1.  Posttraumatic osteoarthritis right subtalar joint 2.  Painful retained hardware right calcaneus    Postop diagnosis: Same    Procedure: 1.  Subtalar joint fusion right foot 2.  Removal of painful calcaneal plate and screws right foot    EBL: Minimal    Anesthesia:regional and general preoperative popliteal block was placed.  At the end of the case 10 cc of 0.25% bupivacaine with epinephrine was infiltrated along all incision sites    Hemostasis: Mid calf tourniquet inflated to 200 mmHg for 120 minutes    Specimen: None    Complications: None    Operative indications:Dana Figueroa is an 43 y.o. that presents today for surgical intervention.  The risks/benefits/alternatives/complications have been discussed and consent has been given.    Procedure:  Patient was brought into the OR and placed on the operating table in thesupine position. After anesthesia was obtained theright lower extremity was prepped and draped in usual sterile fashion.  Attention was turned to the right aspect of the subtalar joint where a curvilinear incision was performed.  Sharp and blunt dissection carried down to the deep tissue.  Subtalar joint was then opened.  The peroneal tendons were noted and retracted throughout the case.  The lateral aspect of the calcaneus was dissected away from the calcaneal plate.  Multiple screws were removed from the subtalar joint incision.  A small percutaneous stab incision was made on the posterior lateral aspect of the calcaneus.  Full-thickness incision was then taken down to the calcaneal plate and final screw removal was performed.  The plate was then removed from the surgical field in toto.  The wound was flushed with copious amounts of irrigation.  Next the subtalar joint was then exposed.  All of the residual articular cartilage was noted and removed with a curette.  There was noted to be  fragmentation of the superior lateral aspect of the subtalar joint and the posterior facet.  This was removed all articular cartilage was removed and this was kept for bone graft for later in the case.  The posterior middle and anterior facet of the subtalar joint was then prepped down to subchondral bone plate.  This was then drilled with a 1.0 mm drill bit.  Trinity bone graft was placed into the subtalar joint and the retained fragmentation of bone was also placed into the subtalar joint for further grafting.  At this time 2 large 6.5 millimeter screws were placed from the posterior aspect of the calcaneus crossing the subtalar joint into the talar body.  Good stability and compression was noted.  Good alignment was noted in all planes.  Final packing of the subtalar joint was performed with residual Trinity bone graft.  Multiple views of fluoroscopy were used throughout the entire procedure.  Layered closure was performed with a 2-0 Vicryl the deeper layer.  3-0 Vicryl for the subcutaneous tissue and a 3-0 nylon for the skin was used.  The area was infiltrated with 0.25% bupivacaine with epinephrine.  A large bulky sterile dressing was applied to the lateral aspect of the right foot and entire right lower leg.  She will be placed in a lower extremity immobilizing boot in the PACU.    Patient tolerated the procedure and anesthesia well.  Was transported from the OR to the PACU with all vital signs stable and vascular status intact. To be discharged per routine protocol.  Will follow up in approximately 1 week in the outpatient clinic.

## 2019-09-25 NOTE — Discharge Instructions (Signed)
[facility]  POST OPERATIVE INSTRUCTIONS FOR DR. TROXLER, DR. Sakari Alkhatib, AND DR. [facility] PODIAT[facility]PART[facility]   1. Take your medication as prescribed.  Pain medication should be taken only as needed.  2. Keep the dressing clean, dry and intact.  3. Keep your foot elevated above the heart level for the first 48 hours.  4. Walking to the bathroom and brief periods of walking are acceptable, unless we have instructed you to be non-weight bearing.  5. Always wear your post-op shoe when walking.  Always use your crutches if you are to be non-weight bearing.  6. Do not take a shower. Baths are permissible as long as the foot is kept out of the water.   7. Every hour you are awake:  - Bend your knee 15 times. - Flex foot 15 times - Massage calf 15 times  8. Call [facility] ([phone]) if any of the following problems occur: - You develop a temperature or fever. - The bandage becomes saturated with blood. - Medication does not stop your pain. - Injury of the foot occurs. - Any symptoms of infection including redness, odor, or red streaks running from wound.  

## 2019-09-25 NOTE — Anesthesia Preprocedure Evaluation (Signed)
Anesthesia Evaluation  Patient identified by MRN, date of birth, ID band Patient awake    Reviewed: Allergy & Precautions, H&P , NPO status , Patient's Chart, lab work & pertinent test results  History of Anesthesia Complications Negative for: history of anesthetic complications  Airway Mallampati: III  TM Distance: <3 FB Neck ROM: full    Dental  (+) Chipped, Dental Advidsory Given   Pulmonary neg pulmonary ROS, neg shortness of breath,           Cardiovascular Exercise Tolerance: Good (-) angina(-) Past MI and (-) DOE negative cardio ROS       Neuro/Psych  Headaches, neg Seizures negative psych ROS   GI/Hepatic negative GI ROS, Neg liver ROS, neg GERD  ,  Endo/Other  negative endocrine ROSHypothyroidism   Renal/GU      Musculoskeletal   Abdominal   Peds  Hematology negative hematology ROS (+)   Anesthesia Other Findings Past Medical History: No date: Headache     Comment:  migraines No date: History of kidney stones No date: Hypothyroidism  Past Surgical History: No date: DIAGNOSTIC LAPAROSCOPY No date: KIDNEY STONE SURGERY  BMI    Body Mass Index:  32.94 kg/m      Reproductive/Obstetrics negative OB ROS                             Anesthesia Physical  Anesthesia Plan  ASA: III  Anesthesia Plan: General   Post-op Pain Management: GA combined w/ Regional for post-op pain   Induction: Intravenous  PONV Risk Score and Plan: Ondansetron, Dexamethasone, Midazolam and Treatment may vary due to age or medical condition  Airway Management Planned: LMA  Additional Equipment:   Intra-op Plan:   Post-operative Plan: Extubation in OR  Informed Consent: I have reviewed the patients History and Physical, chart, labs and discussed the procedure including the risks, benefits and alternatives for the proposed anesthesia with the patient or authorized representative who has  indicated his/her understanding and acceptance.     Dental Advisory Given  Plan Discussed with: Anesthesiologist, CRNA and Surgeon  Anesthesia Plan Comments: (Patient consented for risks of anesthesia including but not limited to:  - adverse reactions to medications - damage to teeth, lips or other oral mucosa - sore throat or hoarseness - Damage to heart, brain, lungs or loss of life  Patient voiced understanding.)        Anesthesia Quick Evaluation

## 2021-03-06 IMAGING — CT CT ANKLE*R* W/O CM
3 of 6 series · 11 of 33 positions shown, 13 images · non-contrast
Comparison: CT scan dated 10/02/2018

CLINICAL DATA: Persistent heel pain. Previous calcaneus fracture
treated with open reduction and internal fixation in September 2018.

EXAM:
CT OF THE RIGHT ANKLE WITHOUT CONTRAST
TECHNIQUE: Multidetector CT imaging of the right ankle was performed according
to the standard protocol. Multiplanar CT image reconstructions were
also generated.

[Series 3: axial bone sfov lower extremity · axial · 0.16mm/px · z∈[+485,+568]mm · 5 of 159 slices shown, 7 images]
[im 27/159  soft-tissue]
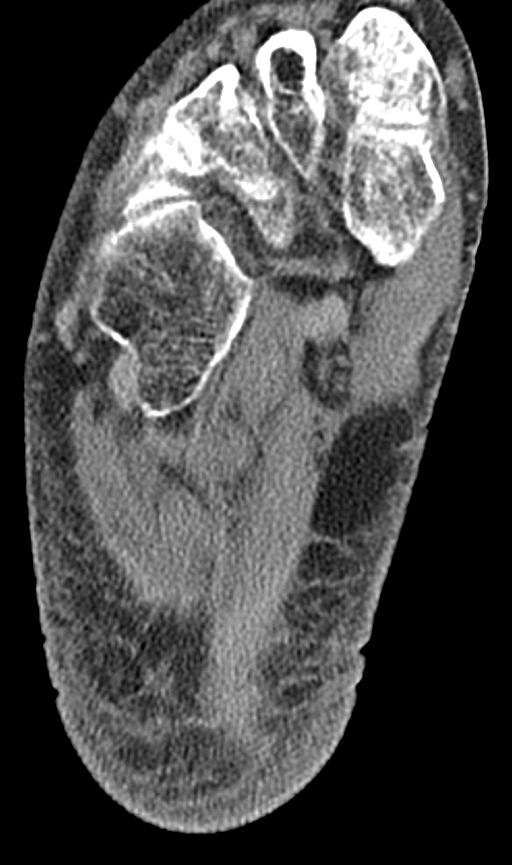
[im 27/159  bone]
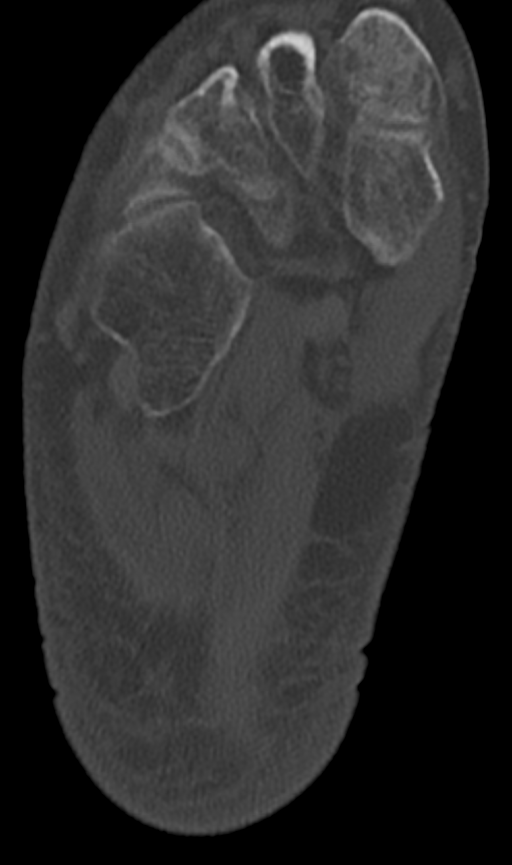
[im 53/159  bone]
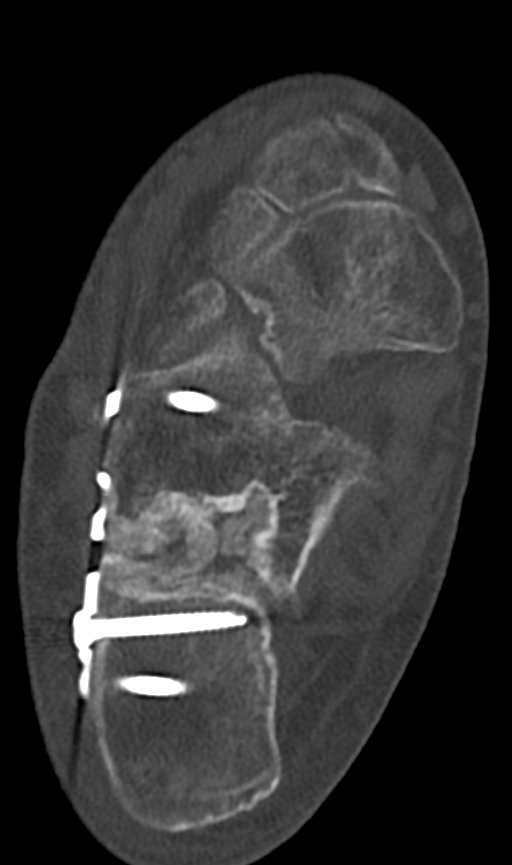
[im 80/159  bone]
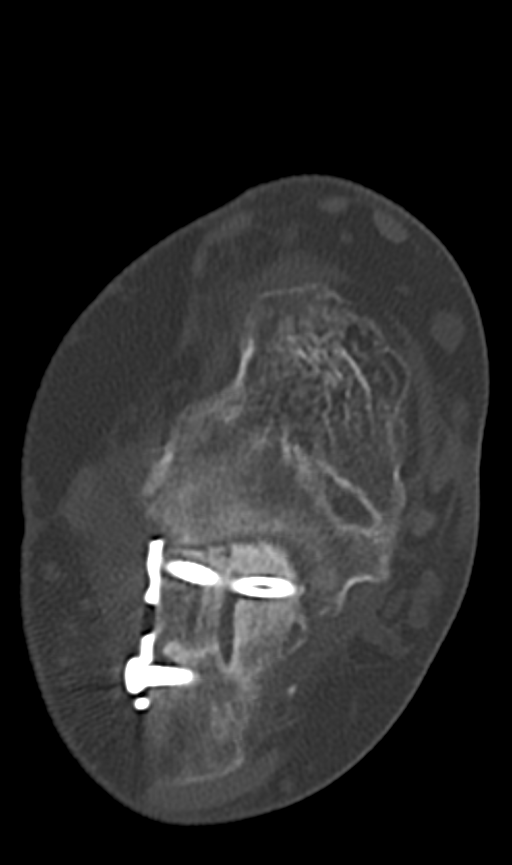
[im 106/159  bone]
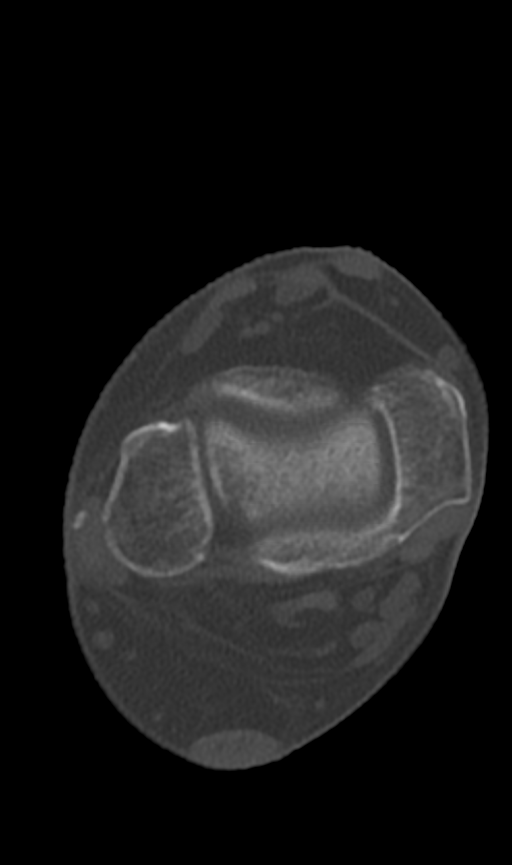
[im 132/159  soft-tissue]
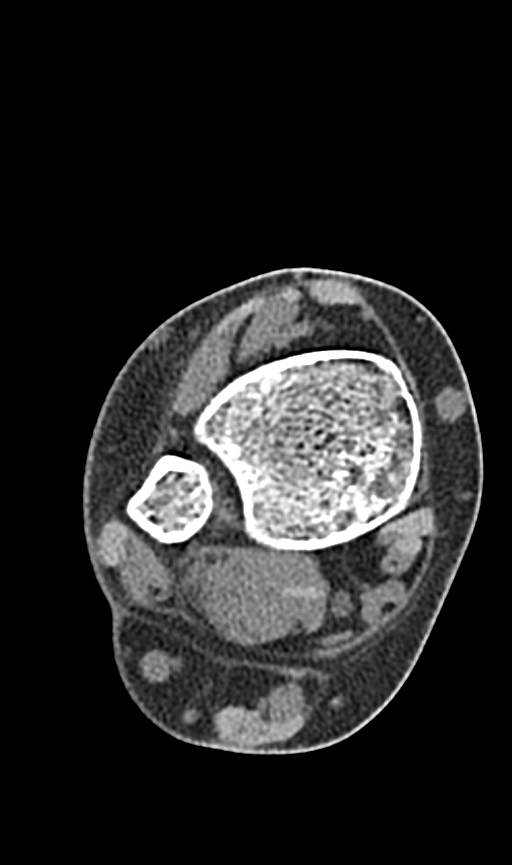
[im 132/159  bone]
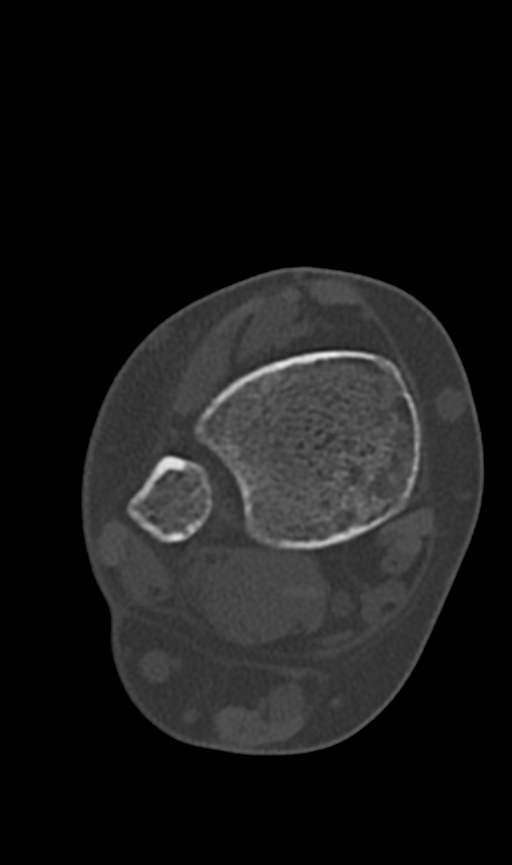

[Series 9: coronal bone sfov lower extremity · coronal · 0.16mm/px · 1 of 195 slices shown]
[im 98/195  bone]
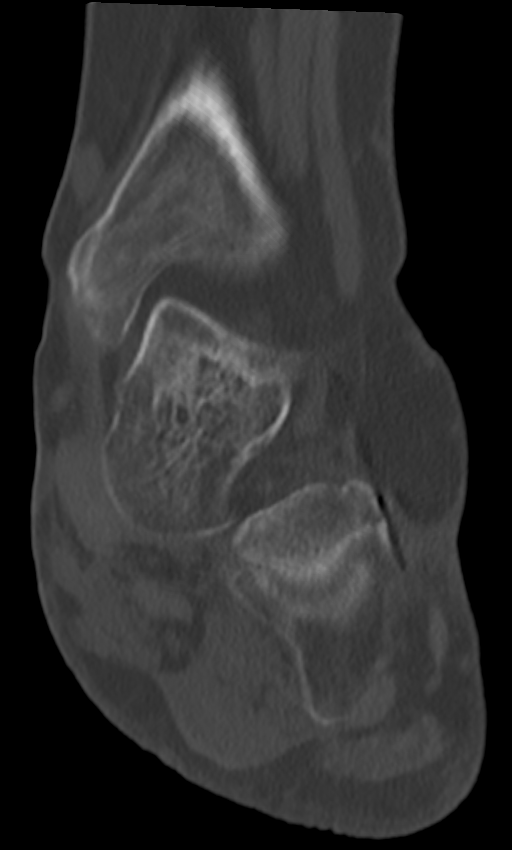

[Series 13: sagittal bone sfov lower extremity · sagittal · 0.26mm/px · 5 of 100 slices shown]
[im 17/100  bone]
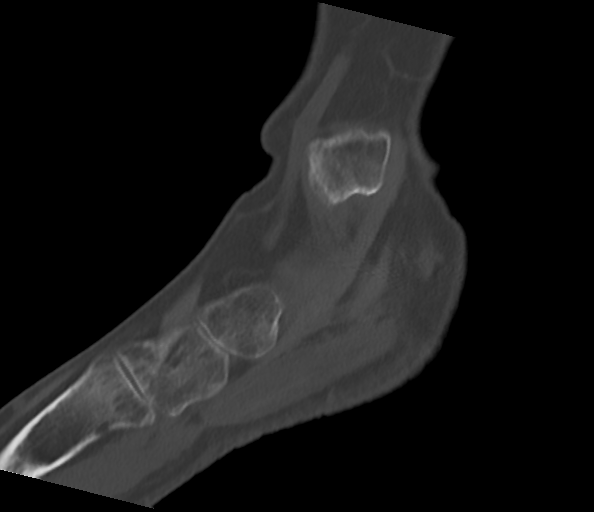
[im 34/100  bone]
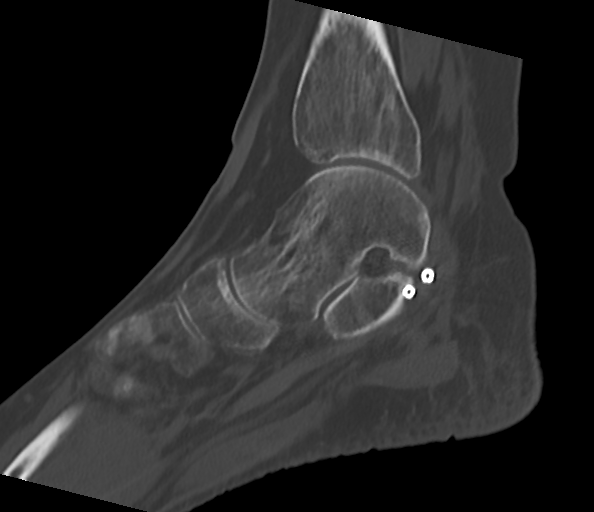
[im 50/100  bone]
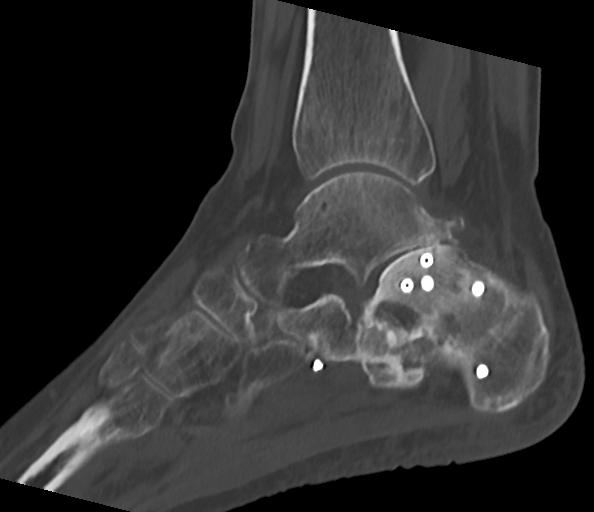
[im 67/100  bone]
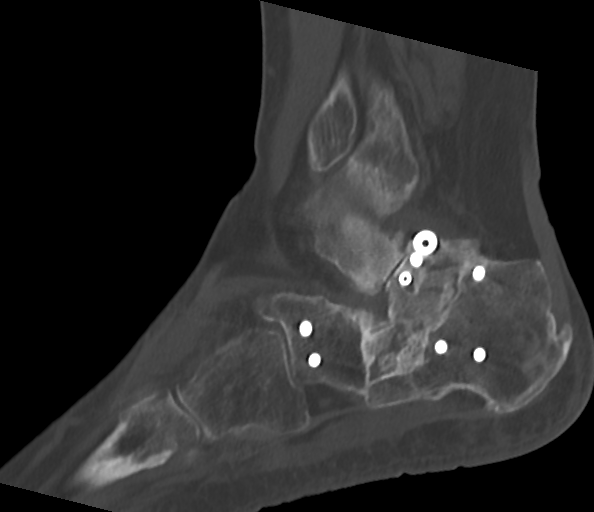
[im 83/100  bone]
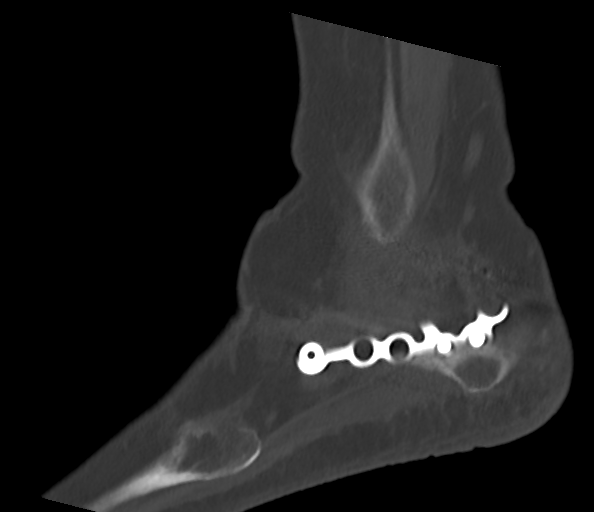

[11 of 33 positions shown; findings below may reference images not displayed]

FINDINGS: Bones/Joint/Cartilage

There has been interval excellent healing of the previously severely
comminuted fracture of the calcaneus. The most superior screw in the
calcaneus does extend into the posterolateral aspect of the
posterior subtalar joint visible on image 60 of series 13. This does
not impinge upon the talar facet however.

There is secondary posttraumatic arthritis of the posterior facet of
the subtalar joint. Small subtalar joint effusion. No significant
ankle effusion.

There is partial healing of the vertical fracture through the
navicular.

Ligaments

Suboptimally assessed by CT. The deltoid ligament appears to be
intact. The anterior talofibular ligament is not identified and may
be chronically torn. The other lateral ligaments are also not
defined.

Muscles and Tendons

The tendons and muscles around the ankle appear normal.

Soft tissues

Negative.
IMPRESSION: 1. Interval excellent healing of the previously severely comminuted
fracture of the calcaneus. The most superior screw in the calcaneus
does extend into the posterolateral aspect of the posterior subtalar
joint.
2. Posttraumatic arthritis of posterior facets of the subtalar
joint.
3. Partial healing of the vertical fracture through the navicular.
4. The lateral ligaments of the ankle are not identified and could
be torn.

## 2021-03-21 NOTE — Progress Notes (Signed)
Noel Journey, MD   Chief Complaint  Patient presents with  . Menorrhagia    Menses started on 02/26/21 and she is still having bleeding some days really heavy with clots. RM 13    HPI:      Ms. Dana Figueroa is a 45 y.o. No obstetric history on file. whose LMP was Patient's last menstrual period was 02/26/2021., presents today for NP eval of AUB this past cycle. Menses are usually 2-3 times a year, lasting 3-4 days, mod flow, changing products Q4 hrs, no BTB, mild dysmen. Menses started 02/26/21 and hasn't stopped. Flow is a little heavier on heavy days, changing products Q3 hrs, some days are lighter. Has intermittent sharp, fleeting RLQ pains. Had similar sx with polypectomy in 2016 and ovar cyst (doesn't remember side); had serial u/s for awhile. No vasomotor sx. No recent thryoid labs; hx of hypothyroidism but stopped levo 88 mcg last yr. Pt requested RF from PCP and was denied so no longer taking it. No FH breast/ovar/uterine ca. She is not sex active. No recent pap. No hx of abn paps.    Past Medical History:  Diagnosis Date  . Headache    migraines  . History of kidney stones   . Hypothyroidism   . Scleroderma [facility])     Past Surgical History:  Procedure Laterality Date  . DIAGNOSTIC LAPAROSCOPY    . FOOT ARTHRODESIS Right 09/25/2019   Procedure: ARTHRODESIS FOOT - STJ RIGHT;  Surgeon: Gwyneth Revels, DPM;  Location: [facility] ORS;  Service: Podiatry;  Laterality: Right;  . KIDNEY STONE SURGERY    . ORIF CALCANEOUS FRACTURE Right 10/10/2018   Procedure: OPEN REDUCTION INTERNAL FIXATION (ORIF) CALCANEOUS FRACTURE;  Surgeon: Gwyneth Revels, DPM;  Location: [facility] ORS;  Service: Podiatry;  Laterality: Right;  . POLYPECTOMY  2016   uterine    Family History  Problem Relation Age of Onset  . Hypertension Mother   . Heart attack Mother   . Cancer Maternal Grandfather     Social History   Socioeconomic History  . Marital status: Single    Spouse name: Not on file   . Number of children: Not on file  . Years of education: Not on file  . Highest education level: Not on file  Occupational History  . Occupation: Advertising account executive  Tobacco Use  . Smoking status: Never Smoker  . Smokeless tobacco: Never Used  Vaping Use  . Vaping Use: Never used  Substance and Sexual Activity  . Alcohol use: Never  . Drug use: Never  . Sexual activity: Not Currently    Birth control/protection: None  Other Topics Concern  . Not on file  Social History Narrative  . Not on file   Social Determinants of Health   Financial Resource Strain: Not on file  Food Insecurity: Not on file  Transportation Needs: Not on file  Physical Activity: Not on file  Stress: Not on file  Social Connections: Not on file  Intimate Partner Violence: Not on file    Outpatient Medications Prior to Visit  Medication Sig Dispense Refill  . Calcium Carbonate-Vitamin D (CALCIUM 600/VITAMIN D PO) Take 1 tablet by mouth daily.    Marland Kitchen levothyroxine (SYNTHROID) 88 MCG tablet Take 88 mcg by mouth daily before breakfast.   2  . Magnesium 250 MG TABS Take 250 mg by mouth daily.     No facility-administered medications prior to visit.      ROS:  Review of Systems  Constitutional: Negative  for fever.  Gastrointestinal: Negative for blood in stool, constipation, diarrhea, nausea and vomiting.  Genitourinary: Positive for menstrual problem. Negative for dyspareunia, dysuria, flank pain, frequency, hematuria, urgency, vaginal bleeding, vaginal discharge and vaginal pain.  Musculoskeletal: Negative for back pain.  Skin: Negative for rash.    OBJECTIVE:   Vitals:  BP (!) 132/96   Pulse 75   Ht 5\' 4"  (1.626 m)   Wt 199 lb (90.3 kg)   LMP 02/26/2021   BMI 34.16 kg/m   Physical Exam Vitals reviewed.  Constitutional:      Appearance: She is well-developed.  Pulmonary:     Effort: Pulmonary effort is normal.  Genitourinary:    General: Normal vulva.     Pubic Area: No rash.       Labia:        Right: No rash, tenderness or lesion.        Left: No rash, tenderness or lesion.      Vagina: Bleeding present. No vaginal discharge, erythema or tenderness.     Cervix: Normal.     Uterus: Normal. Not enlarged and not tender.      Adnexa: Right adnexa normal and left adnexa normal.       Right: No mass or tenderness.         Left: No mass or tenderness.    Musculoskeletal:        General: Normal range of motion.     Cervical back: Normal range of motion.  Skin:    General: Skin is warm and dry.  Neurological:     General: No focal deficit present.     Mental Status: She is alert and oriented to person, place, and time.  Psychiatric:        Mood and Affect: Mood normal.        Behavior: Behavior normal.        Thought Content: Thought content normal.        Judgment: Judgment normal.     Assessment/Plan: Abnormal uterine bleeding (AUB) - Plan: TSH, norethindrone (AYGESTIN) 5 MG tablet, 02/28/2021 PELVIC COMPLETE WITH TRANSVAGINAL, T4, free; sx for past 3 wks. Check thyroid labs due to hx of hypothyroidism and no recent tx. Check pap, GYN u/s. Rx aygestin in meantime. Will f/u with results.  Cervical cancer screening - Plan: Cytology - PAP  Screening for HPV (human papillomavirus) - Plan: Cytology - PAP  Acquired hypothyroidism - Plan: TSH, T4, free  Thyroid disorder screening - Plan: TSH, T4, free    Meds ordered this encounter  Medications  . norethindrone (AYGESTIN) 5 MG tablet    Sig: Take 1 tablet (5 mg total) by mouth daily for 10 days.    Dispense:  10 tablet    Refill:  0    Order Specific Question:   Supervising Provider    Answer:   [place] Nadara Mustard      Return if symptoms worsen or fail to improve.  Chaquana Nichols B. Ilea Hilton, PA-C 03/22/2021 9:28 AM

## 2021-03-22 ENCOUNTER — Ambulatory Visit: Payer: BC Managed Care – PPO | Admitting: Obstetrics and Gynecology

## 2021-03-22 ENCOUNTER — Other Ambulatory Visit (HOSPITAL_COMMUNITY)
Admission: RE | Admit: 2021-03-22 | Discharge: 2021-03-22 | Disposition: A | Discharge status: Home / Self Care | Payer: BC Managed Care – PPO | Source: Ambulatory Visit | Attending: Obstetrics and Gynecology | Admitting: Obstetrics and Gynecology

## 2021-03-22 ENCOUNTER — Other Ambulatory Visit: Payer: Self-pay

## 2021-03-22 ENCOUNTER — Encounter: Payer: Self-pay | Admitting: Obstetrics and Gynecology

## 2021-03-22 VITALS — BP 132/96 | HR 75 | Ht 64.0 in | Wt 199.0 lb

## 2021-03-22 DIAGNOSIS — Z124 Encounter for screening for malignant neoplasm of cervix: Secondary | ICD-10-CM | POA: Insufficient documentation

## 2021-03-22 DIAGNOSIS — Z1151 Encounter for screening for human papillomavirus (HPV): Secondary | ICD-10-CM | POA: Diagnosis not present

## 2021-03-22 DIAGNOSIS — R7989 Other specified abnormal findings of blood chemistry: Secondary | ICD-10-CM

## 2021-03-22 DIAGNOSIS — E039 Hypothyroidism, unspecified: Secondary | ICD-10-CM

## 2021-03-22 DIAGNOSIS — N939 Abnormal uterine and vaginal bleeding, unspecified: Secondary | ICD-10-CM

## 2021-03-22 DIAGNOSIS — Z1329 Encounter for screening for other suspected endocrine disorder: Secondary | ICD-10-CM

## 2021-03-22 MED ORDER — NORETHINDRONE ACETATE 5 MG PO TABS: 5.0000 mg | ORAL_TABLET | Freq: Every day | ORAL | 0 refills | Status: DC

## 2021-03-22 NOTE — Patient Instructions (Signed)
I value your feedback and you entrusting us with your care. If you get a [facility] patient survey, I would appreciate you taking the time to let us know about your experience today. Thank you! ? ? ?

## 2021-03-23 ENCOUNTER — Other Ambulatory Visit: Payer: Self-pay | Admitting: Obstetrics and Gynecology

## 2021-03-23 LAB — CYTOLOGY - PAP
Comment: NEGATIVE
Diagnosis: NEGATIVE
High risk HPV: NEGATIVE

## 2021-03-23 LAB — TSH: TSH: 6.1 u[IU]/mL — ABNORMAL HIGH (ref 0.450–4.500)

## 2021-03-23 MED ORDER — LEVOTHYROXINE SODIUM 88 MCG PO TABS: 88.0000 ug | ORAL_TABLET | Freq: Every day | ORAL | 1 refills | Status: DC

## 2021-03-23 NOTE — Progress Notes (Signed)
Rx RF levo 88 mcg. Pt's initial dose, stopped about a yr ago. Pt to sched f/u with PCP

## 2021-03-23 NOTE — Addendum Note (Signed)
Addended by: Althea Grimmer B on: 03/23/2021 01:33 PM   Modules accepted: Orders

## 2021-03-24 NOTE — Telephone Encounter (Signed)
No, pt needs to f/u with PCP for mgmt and to make sure on right dose.

## 2021-03-26 LAB — T4, FREE: Free T4: 0.96 ng/dL (ref 0.82–1.77)

## 2021-03-26 LAB — SPECIMEN STATUS REPORT

## 2021-04-07 ENCOUNTER — Encounter: Payer: Self-pay | Admitting: Obstetrics and Gynecology

## 2021-04-07 ENCOUNTER — Other Ambulatory Visit: Payer: Self-pay | Admitting: Obstetrics and Gynecology

## 2021-04-07 DIAGNOSIS — N939 Abnormal uterine and vaginal bleeding, unspecified: Secondary | ICD-10-CM

## 2021-04-07 MED ORDER — NORETHINDRONE ACETATE 5 MG PO TABS: 5.0000 mg | ORAL_TABLET | Freq: Every day | ORAL | 0 refills | Status: DC

## 2021-04-07 NOTE — Progress Notes (Signed)
Rx RF aygestin for menorrhagia. Awaiting GYN u/s

## 2021-05-09 ENCOUNTER — Encounter: Payer: Self-pay | Admitting: Certified Nurse Midwife

## 2021-05-09 ENCOUNTER — Ambulatory Visit
Admission: RE | Admit: 2021-05-09 | Discharge: 2021-05-09 | Disposition: A | Discharge status: Home / Self Care | Payer: BC Managed Care – PPO | Source: Ambulatory Visit | Attending: Obstetrics and Gynecology | Admitting: Obstetrics and Gynecology

## 2021-05-09 ENCOUNTER — Other Ambulatory Visit: Payer: Self-pay

## 2021-05-09 DIAGNOSIS — N939 Abnormal uterine and vaginal bleeding, unspecified: Secondary | ICD-10-CM | POA: Insufficient documentation

## 2021-05-11 ENCOUNTER — Telehealth: Payer: Self-pay | Admitting: Obstetrics and Gynecology

## 2021-05-11 DIAGNOSIS — N83201 Unspecified ovarian cyst, right side: Secondary | ICD-10-CM

## 2021-05-11 NOTE — Telephone Encounter (Signed)
Spoke with pt re: GYN u/s results. AUB has resolved. Had to do 2nd dose of aygestin 5/22 but no bleeding since. Pt also with subclinical hypothyroidism and now back on levo Rx. Saw PCP and dose adjusted.  Pt with 5.5 cm RTO cyst, no pain. Will recheck u/s in 8 wks. Pt aware to go to ED if develops severe RLQ pain. Also to f/u prn AUB.

## 2021-06-01 ENCOUNTER — Other Ambulatory Visit: Payer: Self-pay

## 2021-06-01 ENCOUNTER — Ambulatory Visit
Admission: RE | Admit: 2021-06-01 | Discharge: 2021-06-01 | Disposition: A | Discharge status: Home / Self Care | Payer: BC Managed Care – PPO | Source: Ambulatory Visit | Attending: Obstetrics and Gynecology | Admitting: Obstetrics and Gynecology

## 2021-06-01 DIAGNOSIS — N83201 Unspecified ovarian cyst, right side: Secondary | ICD-10-CM | POA: Insufficient documentation

## 2021-06-05 ENCOUNTER — Telehealth: Payer: Self-pay | Admitting: Obstetrics and Gynecology

## 2021-06-05 DIAGNOSIS — N83201 Unspecified ovarian cyst, right side: Secondary | ICD-10-CM

## 2021-06-05 NOTE — Telephone Encounter (Signed)
Pt aware of GYN u/s results. RTO cyst is stable at 5 cm from 4 wks prior (was supposed to be 8 wks so not sure why done sooner) . Will recheck GYN u/s in 12 wks or sooner prn.  Pt had AUB 5/22, had to take aygestin. No bleeding till 7/22 menses last wk, lasted 7 days. Heavy flow for 1 day and severe dysmen/RLQ pain same day as Gyn u/s, no improvement with NSAIDs. Pain better now and is just dull ache in pelvic area (not RLQ only).  Discussed s/s ovar torsion. Will repeat u/s for now but if sx worsen, will refer to MD for surg eval. Pt agrees with plan.

## 2021-08-24 ENCOUNTER — Ambulatory Visit: Payer: BC Managed Care – PPO

## 2021-08-28 ENCOUNTER — Ambulatory Visit: Payer: BC Managed Care – PPO

## 2021-09-07 ENCOUNTER — Other Ambulatory Visit: Payer: Self-pay

## 2021-09-07 ENCOUNTER — Ambulatory Visit
Admission: RE | Admit: 2021-09-07 | Discharge: 2021-09-07 | Disposition: A | Discharge status: Home / Self Care | Payer: BC Managed Care – PPO | Source: Ambulatory Visit | Attending: Obstetrics and Gynecology | Admitting: Obstetrics and Gynecology

## 2021-09-07 DIAGNOSIS — N83201 Unspecified ovarian cyst, right side: Secondary | ICD-10-CM | POA: Diagnosis not present

## 2021-09-11 ENCOUNTER — Telehealth: Payer: Self-pay | Admitting: Obstetrics and Gynecology

## 2021-09-11 NOTE — Telephone Encounter (Signed)
Pt aware of small leio on GYN u/s results. No longer having AUB (last time was 5/22). RTO cyst looks benign, no further imaging needed. Pt to f/u prn pain.

## 2021-11-28 ENCOUNTER — Other Ambulatory Visit: Payer: Self-pay

## 2021-11-28 ENCOUNTER — Encounter: Payer: Self-pay | Admitting: Internal Medicine

## 2021-11-28 ENCOUNTER — Ambulatory Visit: Payer: BC Managed Care – PPO | Admitting: Internal Medicine

## 2021-11-28 VITALS — BP 117/73 | HR 105 | Temp 97.8°F | Resp 17 | Ht 64.0 in | Wt 206.8 lb

## 2021-11-28 DIAGNOSIS — I10 Essential (primary) hypertension: Secondary | ICD-10-CM | POA: Diagnosis not present

## 2021-11-28 DIAGNOSIS — E039 Hypothyroidism, unspecified: Secondary | ICD-10-CM | POA: Diagnosis not present

## 2021-11-28 DIAGNOSIS — Z23 Encounter for immunization: Secondary | ICD-10-CM

## 2021-11-28 DIAGNOSIS — E66811 Obesity, class 1: Secondary | ICD-10-CM | POA: Insufficient documentation

## 2021-11-28 DIAGNOSIS — M349 Systemic sclerosis, unspecified: Secondary | ICD-10-CM | POA: Insufficient documentation

## 2021-11-28 DIAGNOSIS — E6609 Other obesity due to excess calories: Secondary | ICD-10-CM | POA: Insufficient documentation

## 2021-11-28 DIAGNOSIS — G43909 Migraine, unspecified, not intractable, without status migrainosus: Secondary | ICD-10-CM | POA: Insufficient documentation

## 2021-11-28 DIAGNOSIS — Z6835 Body mass index (BMI) 35.0-35.9, adult: Secondary | ICD-10-CM

## 2021-11-28 DIAGNOSIS — G43C1 Periodic headache syndromes in child or adult, intractable: Secondary | ICD-10-CM

## 2021-11-28 LAB — CBC
HCT: 43.5 % (ref 35.0–45.0)
Hemoglobin: 14.5 g/dL (ref 11.7–15.5)
MCH: 28.2 pg (ref 27.0–33.0)
MCHC: 33.3 g/dL (ref 32.0–36.0)
MCV: 84.5 fL (ref 80.0–100.0)
MPV: 11.6 fL (ref 7.5–12.5)
Platelets: 244 10*3/uL (ref 140–400)
RBC: 5.15 10*6/uL — ABNORMAL HIGH (ref 3.80–5.10)
RDW: 12.6 % (ref 11.0–15.0)
WBC: 8.1 10*3/uL (ref 3.8–10.8)

## 2021-11-28 LAB — COMPLETE METABOLIC PANEL WITH GFR
AG Ratio: 1.6 (calc) (ref 1.0–2.5)
ALT: 52 U/L — ABNORMAL HIGH (ref 6–29)
AST: 38 U/L — ABNORMAL HIGH (ref 10–35)
Albumin: 4.4 g/dL (ref 3.6–5.1)
Alkaline phosphatase (APISO): 89 U/L (ref 31–125)
BUN: 15 mg/dL (ref 7–25)
CO2: 26 mmol/L (ref 20–32)
Calcium: 9.8 mg/dL (ref 8.6–10.2)
Chloride: 105 mmol/L (ref 98–110)
Creat: 0.79 mg/dL (ref 0.50–0.99)
Globulin: 2.8 g/dL (calc) (ref 1.9–3.7)
Glucose, Bld: 117 mg/dL — ABNORMAL HIGH (ref 65–99)
Potassium: 4.1 mmol/L (ref 3.5–5.3)
Sodium: 140 mmol/L (ref 135–146)
Total Bilirubin: 0.3 mg/dL (ref 0.2–1.2)
Total Protein: 7.2 g/dL (ref 6.1–8.1)
eGFR: 93 mL/min/{1.73_m2} (ref >=60)

## 2021-11-28 LAB — HEMOGLOBIN A1C
Hgb A1c MFr Bld: 6.1 % of total Hgb — ABNORMAL HIGH (ref <5.7)
Mean Plasma Glucose: 128 mg/dL
eAG (mmol/L): 7.1 mmol/L

## 2021-11-28 LAB — TSH: TSH: 4.95 mIU/L — ABNORMAL HIGH

## 2021-11-28 LAB — LIPID PANEL
Cholesterol: 197 mg/dL (ref <200)
HDL: 42 mg/dL — ABNORMAL LOW (ref >=50)
LDL Cholesterol (Calc): 128 mg/dL (calc) — ABNORMAL HIGH
Non-HDL Cholesterol (Calc): 155 mg/dL (calc) — ABNORMAL HIGH (ref <130)
Total CHOL/HDL Ratio: 4.7 (calc) (ref <5.0)
Triglycerides: 155 mg/dL — ABNORMAL HIGH (ref <150)

## 2021-11-28 LAB — T4, FREE: Free T4: 0.9 ng/dL (ref 0.8–1.8)

## 2021-11-28 NOTE — Assessment & Plan Note (Signed)
Encourage diet and exercise for weight loss Referral to nutrition services Consider Arville Lime, Ozempic pending labs

## 2021-11-28 NOTE — Assessment & Plan Note (Signed)
Controlled on Telmisartan Reinforced DASH diet and exercise for weight loss C-Met today

## 2021-11-28 NOTE — Patient Instructions (Signed)

## 2021-11-28 NOTE — Progress Notes (Signed)
HPI  Patient presents to clinic today to establish care and for management of the conditions listed below.  HTN: Her BP today is 117/73.  She is taking Telmisartan as prescribed.  There is no ECG on file.  Hypothyroidism: She is not currently taking Levothyroxine. She does not follow with endocrinology.  Migraines: Triggered by caffeine. They occur rarely. She gets good relief with Excedrin Migraine and an ice packs.  Scleroderma: Not currently medicated for this. She reports intermittent Raynauds in her hands and feet. She does not follow with dermatology.   She would like to discuss her weight.  Her weight today is 206 pounds with a BMI of 35.5.  She has noticed shortness of breath when she bends over but is not sure if this is related to her weight or a possible cardiac condition.  Past Medical History:  Diagnosis Date   Headache    migraines   History of kidney stones    Hypothyroidism    Ovarian cyst    Scleroderma (HCC)     Current Outpatient Medications  Medication Sig Dispense Refill   Calcium Carb-Cholecalciferol (CALCIUM 500/VITAMIN D) 500-3.125 MG-MCG TABS Take by mouth.     telmisartan (MICARDIS) 80 MG tablet Take 80 mg by mouth daily.     vitamin B-12 (CYANOCOBALAMIN) 500 MCG tablet Take 500 mcg by mouth daily.     No current facility-administered medications for this visit.    Allergies  Allergen Reactions   Wound Dressing Adhesive Other (See Comments)    Skin tears   Codeine Rash   Steri-Strip Compound Benzoin [Benzoin Compound] Itching, Rash and Other (See Comments)    Skin redness/skin blisters    Family History  Problem Relation Age of Onset   Hypertension Mother    Heart attack Mother    Cancer Maternal Grandfather     Social History   Socioeconomic History   Marital status: Single    Spouse name: Not on file   Number of children: Not on file   Years of education: Not on file   Highest education level: Not on file  Occupational History    Occupation: Advertising account executive  Tobacco Use   Smoking status: Never   Smokeless tobacco: Never  Vaping Use   Vaping Use: Never used  Substance and Sexual Activity   Alcohol use: Never   Drug use: Never   Sexual activity: Not Currently    Birth control/protection: None  Other Topics Concern   Not on file  Social History Narrative   Not on file   Social Determinants of Health   Financial Resource Strain: Not on file  Food Insecurity: Not on file  Transportation Needs: Not on file  Physical Activity: Not on file  Stress: Not on file  Social Connections: Not on file  Intimate Partner Violence: Not on file    ROS:  Constitutional: Pt reports intermittent headaches. Denies fever, malaise, fatigue, or abrupt weight changes.  HEENT: Denies eye pain, eye redness, ear pain, ringing in the ears, wax buildup, runny nose, nasal congestion, bloody nose, or sore throat. Respiratory: Pt reports intermittent shortness of breath. Denies difficulty breathing, cough or sputum production.   Cardiovascular: Denies chest pain, chest tightness, palpitations or swelling in the hands or feet.  Gastrointestinal: Denies abdominal pain, bloating, constipation, diarrhea or blood in the stool.  GU: Denies frequency, urgency, pain with urination, blood in urine, odor or discharge. Musculoskeletal: Denies decrease in range of motion, difficulty with gait, muscle pain or joint pain  and swelling.  Skin: Denies redness, rashes, lesions or ulcercations.  Neurological: Pt reports intermittent left sided sciatica. Denies dizziness, difficulty with memory, difficulty with speech or problems with balance and coordination.  Psych: Denies anxiety, depression, SI/HI.  No other specific complaints in a complete review of systems (except as listed in HPI above).  PE:  BP 117/73 (BP Location: Right Arm, Patient Position: Sitting, Cuff Size: Large)    Pulse (!) 105    Temp 97.8 F (36.6 C) (Temporal)    Resp 17    Ht 5'  4" (1.626 m)    Wt 206 lb 12.8 oz (93.8 kg)    LMP 11/28/2020    SpO2 100%    BMI 35.50 kg/m  Wt Readings from Last 3 Encounters:  11/28/21 206 lb 12.8 oz (93.8 kg)  03/22/21 199 lb (90.3 kg)  09/25/19 198 lb (89.8 kg)    General: Appears her stated age, obese, in NAD. HEENT: Head: normal shape and size; Eyes: sclera white, and EOMs intact; Neck: Neck supple, trachea midline. No masses, lumps or thyromegaly present.  Cardiovascular: Normal rate and rhythm. S1,S2 noted.  No murmur, rubs or gallops noted. No JVD or BLE edema. No carotid bruits noted. Pulmonary/Chest: Normal effort and positive vesicular breath sounds. No respiratory distress. No wheezes, rales or ronchi noted.  Musculoskeletal: No difficulty with gait.  Neurological: Alert and oriented.  Psychiatric: Mood and affect normal. Behavior is normal. Judgment and thought content normal.    BMET    Component Value Date/Time   CREATININE 0.68 10/10/2018 1238   GFRNONAA >60 10/10/2018 1238   GFRAA >60 10/10/2018 1238    Lipid Panel  No results found for: CHOL, TRIG, HDL, CHOLHDL, VLDL, LDLCALC  CBC    Component Value Date/Time   WBC 11.0 (H) 10/10/2018 1238   RBC 4.53 10/10/2018 1238   HGB 12.9 10/10/2018 1238   HCT 39.4 10/10/2018 1238   PLT 334 10/10/2018 1238   MCV 87.0 10/10/2018 1238   MCH 28.5 10/10/2018 1238   MCHC 32.7 10/10/2018 1238   RDW 12.0 10/10/2018 1238    Hgb A1C No results found for: HGBA1C   Assessment and Plan:  Nicki Reaper, NP This visit occurred during the SARS-CoV-2 public health emergency.  Safety protocols were in place, including screening questions prior to the visit, additional usage of staff PPE, and extensive cleaning of exam room while observing appropriate contact time as indicated for disinfecting solutions.

## 2021-11-28 NOTE — Assessment & Plan Note (Signed)
Try to avoid caffeine withdrawal as this triggers her migraines Continue Excedrin Migraine OTC

## 2021-11-28 NOTE — Assessment & Plan Note (Signed)
TSH and free T4 today Will likely need to restart Levothyroxine if TSH elevated

## 2021-11-28 NOTE — Assessment & Plan Note (Signed)
Currently not being treated without issues Will monitor

## 2021-11-29 ENCOUNTER — Encounter: Payer: Self-pay | Admitting: Internal Medicine

## 2021-11-29 DIAGNOSIS — E782 Mixed hyperlipidemia: Secondary | ICD-10-CM

## 2021-11-29 MED ORDER — SIMVASTATIN 10 MG PO TABS: 10.0000 mg | ORAL_TABLET | Freq: Every day | ORAL | 2 refills | Status: DC

## 2022-01-18 ENCOUNTER — Encounter: Payer: BC Managed Care – PPO | Attending: Internal Medicine | Admitting: Dietician

## 2022-01-18 ENCOUNTER — Encounter: Payer: Self-pay | Admitting: Dietician

## 2022-01-18 ENCOUNTER — Other Ambulatory Visit: Payer: Self-pay

## 2022-01-18 VITALS — Ht 64.0 in | Wt 204.2 lb

## 2022-01-18 DIAGNOSIS — Z6835 Body mass index (BMI) 35.0-35.9, adult: Secondary | ICD-10-CM | POA: Diagnosis not present

## 2022-01-18 DIAGNOSIS — I10 Essential (primary) hypertension: Secondary | ICD-10-CM
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
## 2022-01-18 NOTE — Patient Instructions (Signed)
Continue to make mostly healthy food choices, great job so far! ?Increase fruits and vegetables with meals; plan to have at least one veg or fruit with each meals, include some for snacks too, such as carrots and hummus, apple or banana and peanut butter, yogurt and berries. ?Gradually resume exercise -- start with short time and increase little by little as energy and stamina increases ? ?

## 2022-02-26 ENCOUNTER — Other Ambulatory Visit: Payer: Self-pay | Admitting: Internal Medicine

## 2022-02-26 ENCOUNTER — Other Ambulatory Visit: Payer: BC Managed Care – PPO

## 2022-02-26 DIAGNOSIS — E782 Mixed hyperlipidemia: Secondary | ICD-10-CM

## 2022-02-27 LAB — COMPREHENSIVE METABOLIC PANEL
AG Ratio: 1.6 (calc) (ref 1.0–2.5)
ALT: 36 U/L — ABNORMAL HIGH (ref 6–29)
AST: 25 U/L (ref 10–35)
Albumin: 4.2 g/dL (ref 3.6–5.1)
Alkaline phosphatase (APISO): 89 U/L (ref 31–125)
BUN: 15 mg/dL (ref 7–25)
CO2: 26 mmol/L (ref 20–32)
Calcium: 9.1 mg/dL (ref 8.6–10.2)
Chloride: 109 mmol/L (ref 98–110)
Creat: 0.74 mg/dL (ref 0.50–0.99)
Globulin: 2.7 g/dL (calc) (ref 1.9–3.7)
Glucose, Bld: 117 mg/dL (ref 65–139)
Potassium: 3.9 mmol/L (ref 3.5–5.3)
Sodium: 143 mmol/L (ref 135–146)
Total Bilirubin: 0.5 mg/dL (ref 0.2–1.2)
Total Protein: 6.9 g/dL (ref 6.1–8.1)

## 2022-02-27 LAB — LIPID PANEL
Cholesterol: 156 mg/dL (ref <200)
HDL: 46 mg/dL — ABNORMAL LOW (ref >=50)
LDL Cholesterol (Calc): 88 mg/dL (calc)
Non-HDL Cholesterol (Calc): 110 mg/dL (calc) (ref <130)
Total CHOL/HDL Ratio: 3.4 (calc) (ref <5.0)
Triglycerides: 128 mg/dL (ref <150)

## 2022-02-27 NOTE — Telephone Encounter (Signed)
Requested Prescriptions  ?Pending Prescriptions Disp Refills  ?? simvastatin (ZOCOR) 10 MG tablet [Pharmacy Med Name: SIMVASTATIN 10 MG TAB[*]] 30 tablet 2  ?  Sig: TAKE ONE TABLET BY MOUTH AT BEDTIME  ?  ? Cardiovascular:  Antilipid - Statins Failed - 02/26/2022 12:18 PM  ?  ?  Failed - Lipid Panel in normal range within the last 12 months  ?  Cholesterol  ?Date Value Ref Range Status  ?02/26/2022 156 <200 mg/dL Final  ? ?LDL Cholesterol (Calc)  ?Date Value Ref Range Status  ?02/26/2022 88 mg/dL (calc) Final  ?  Comment:  ?  Reference range: <100 ?Marland Kitchen ?Desirable range <100 mg/dL for primary prevention;   ?<70 mg/dL for patients with CHD or diabetic patients  ?with > or = 2 CHD risk factors. ?. ?LDL-C is now calculated using the Martin-Hopkins  ?calculation, which is a validated novel method providing  ?better accuracy than the Friedewald equation in the  ?estimation of LDL-C.  ?Horald Pollen et al. Lenox Ahr. 6503;546(56): 2061-2068  ?(http://education.[facility].com/faq/FAQ164) ?  ? ?HDL  ?Date Value Ref Range Status  ?02/26/2022 46 (L) > OR = 50 mg/dL Final  ? ?Triglycerides  ?Date Value Ref Range Status  ?02/26/2022 128 <150 mg/dL Final  ? ?  ?  ?  Passed - Patient is not pregnant  ?  ?  Passed - Valid encounter within last 12 months  ?  Recent Outpatient Visits   ?      ? 3 months ago Primary hypertension  ? [facility] [place], Salvadore Oxford, NP  ?  ?  ? ?  ?  ?  ? ?

## 2022-03-07 ENCOUNTER — Ambulatory Visit: Payer: BC Managed Care – PPO | Admitting: Dietician

## 2022-04-16 ENCOUNTER — Encounter: Payer: Self-pay | Admitting: Dietician

## 2022-04-16 NOTE — Progress Notes (Signed)
Have not heard back from patient to reschedule her cancelled appointment from 03/07/22. Sent notification to referring provider. 

## 2022-06-12 ENCOUNTER — Other Ambulatory Visit: Payer: Self-pay | Admitting: Internal Medicine

## 2022-06-12 DIAGNOSIS — E782 Mixed hyperlipidemia: Secondary | ICD-10-CM

## 2022-06-14 NOTE — Telephone Encounter (Signed)
Requested Prescriptions  Pending Prescriptions Disp Refills  . simvastatin (ZOCOR) 10 MG tablet [Pharmacy Med Name: SIMVASTATIN 10 MG TAB[*]] 90 tablet 0    Sig: TAKE ONE TABLET BY MOUTH AT BEDTIME     Cardiovascular:  Antilipid - Statins Failed - 06/12/2022  8:51 PM      Failed - Lipid Panel in normal range within the last 12 months    Cholesterol  Date Value Ref Range Status  02/26/2022 156 <200 mg/dL Final   LDL Cholesterol (Calc)  Date Value Ref Range Status  02/26/2022 88 mg/dL (calc) Final    Comment:    Reference range: <100 . Desirable range <100 mg/dL for primary prevention;   <70 mg/dL for patients with CHD or diabetic patients  with > or = 2 CHD risk factors. Marland Kitchen LDL-C is now calculated using the Martin-Hopkins  calculation, which is a validated novel method providing  better accuracy than the Friedewald equation in the  estimation of LDL-C.  Horald Pollen et al. Lenox Ahr. 1610;960(45): 2061-2068  (http://education.[facility].com/faq/FAQ164)    HDL  Date Value Ref Range Status  02/26/2022 46 (L) > OR = 50 mg/dL Final   Triglycerides  Date Value Ref Range Status  02/26/2022 128 <150 mg/dL Final         Passed - Patient is not pregnant      Passed - Valid encounter within last 12 months    Recent Outpatient Visits          6 months ago Primary hypertension   [facility] [place], Salvadore Oxford, NP

## 2022-07-11 ENCOUNTER — Encounter: Payer: Self-pay | Admitting: Internal Medicine

## 2022-07-12 NOTE — Telephone Encounter (Signed)
Can you add her on Friday at 1 PM

## 2022-07-13 ENCOUNTER — Ambulatory Visit (INDEPENDENT_AMBULATORY_CARE_PROVIDER_SITE_OTHER): Payer: BC Managed Care – PPO | Admitting: Internal Medicine

## 2022-07-13 ENCOUNTER — Encounter: Payer: Self-pay | Admitting: Internal Medicine

## 2022-07-13 VITALS — BP 126/85 | HR 90 | Ht 64.0 in | Wt 204.0 lb

## 2022-07-13 DIAGNOSIS — H6983 Other specified disorders of Eustachian tube, bilateral: Secondary | ICD-10-CM

## 2022-07-13 DIAGNOSIS — R7303 Prediabetes: Secondary | ICD-10-CM | POA: Insufficient documentation

## 2022-07-13 MED ORDER — PREDNISONE 10 MG PO TABS: ORAL_TABLET | ORAL | 0 refills | Status: DC

## 2022-07-13 NOTE — Patient Instructions (Signed)
Eustachian Tube Dysfunction  Eustachian tube dysfunction refers to a condition in which a blockage develops in the narrow passage that connects the middle ear to the back of the nose (eustachian tube). The eustachian tube regulates air pressure in the middle ear by letting air move between the ear and nose. It also helps to drain fluid from the middle ear space. Eustachian tube dysfunction can affect one or both ears. When the eustachian tube does not function properly, air pressure, fluid, or both can build up in the middle ear. What are the causes? This condition occurs when the eustachian tube becomes blocked or cannot open normally. Common causes of this condition include: Ear infections. Colds and other infections that affect the nose, mouth, and throat (upper respiratory tract). Allergies. Irritation from cigarette smoke. Irritation from stomach acid coming up into the esophagus (gastroesophageal reflux). The esophagus is the part of the body that moves food from the mouth to the stomach. Sudden changes in air pressure, such as from descending in an airplane or scuba diving. Abnormal growths in the nose or throat, such as: Growths that line the nose (nasal polyps). Abnormal growth of cells (tumors). Enlarged tissue at the back of the throat (adenoids). What increases the risk? You are more likely to develop this condition if: You smoke. You are overweight. You are a child who has: Certain birth defects of the mouth, such as cleft palate. Large tonsils or adenoids. What are the signs or symptoms? Common symptoms of this condition include: A feeling of fullness in the ear. Ear pain. Clicking or popping noises in the ear. Ringing in the ear (tinnitus). Hearing loss. Loss of balance. Dizziness. Symptoms may get worse when the air pressure around you changes, such as when you travel to an area of high elevation, fly on an airplane, or go scuba diving. How is this diagnosed? This  condition may be diagnosed based on: Your symptoms. A physical exam of your ears, nose, and throat. Tests, such as those that measure: The movement of your eardrum. Your hearing (audiometry). How is this treated? Treatment depends on the cause and severity of your condition. In mild cases, you may relieve your symptoms by moving air into your ears. This is called "popping the ears." In more severe cases, or if you have symptoms of fluid in your ears, treatment may include: Medicines to relieve congestion (decongestants). Medicines that treat allergies (antihistamines). Nasal sprays or ear drops that contain medicines that reduce swelling (steroids). A procedure to drain the fluid in your eardrum. In this procedure, a small tube may be placed in the eardrum to: Drain the fluid. Restore the air in the middle ear space. A procedure to insert a balloon device through the nose to inflate the opening of the eustachian tube (balloon dilation). Follow these instructions at home: Lifestyle Do not do any of the following until your health care provider approves: Travel to high altitudes. Fly in airplanes. Work in a pressurized cabin or room. Scuba dive. Do not use any products that contain nicotine or tobacco. These products include cigarettes, chewing tobacco, and vaping devices, such as e-cigarettes. If you need help quitting, ask your health care provider. Keep your ears dry. Wear fitted earplugs during showering and bathing. Dry your ears completely after. General instructions Take over-the-counter and prescription medicines only as told by your health care provider. Use techniques to help pop your ears as recommended by your health care provider. These may include: Chewing gum. Yawning. Frequent, forceful swallowing.   Closing your mouth, holding your nose closed, and gently blowing as if you are trying to blow air out of your nose. Keep all follow-up visits. This is important. Contact a  health care provider if: Your symptoms do not go away after treatment. Your symptoms come back after treatment. You are unable to pop your ears. You have: A fever. Pain in your ear. Pain in your head or neck. Fluid draining from your ear. Your hearing suddenly changes. You become very dizzy. You lose your balance. Get help right away if: You have a sudden, severe increase in any of your symptoms. Summary Eustachian tube dysfunction refers to a condition in which a blockage develops in the eustachian tube. It can be caused by ear infections, allergies, inhaled irritants, or abnormal growths in the nose or throat. Symptoms may include ear pain or fullness, hearing loss, or ringing in the ears. Mild cases are treated with techniques to unblock the ears, such as yawning or chewing gum. More severe cases are treated with medicines or procedures. This information is not intended to replace advice given to you by your health care provider. Make sure you discuss any questions you have with your health care provider. Document Revised: 01/09/2021 Document Reviewed: 01/09/2021 Elsevier Patient Education  2023 Elsevier Inc.  

## 2022-07-13 NOTE — Progress Notes (Signed)
Subjective:    Patient ID: Dana Figueroa, female    DOB: Mar 05, 1976, 46 y.o.   MRN: [id number]  HPI  Patient presents to clinic today with complains of ear fullness, R>L.  She reports this started 5 days ago after flying from [place] to [place]. She reports associated nasal congestion. She is blowing clear mucous out of her nose. She denies headache, facial pressure, runny nose, sore throat, cough, nausea, vomiting. She denies fever, chills or body aches. She has tried Sudafed and Flonase OTC with minimal relief of symptoms. She has not had sick contacts that she is aware of.   Review of Systems     Past Medical History:  Diagnosis Date   Headache    migraines   History of kidney stones    Hypothyroidism    Ovarian cyst    Scleroderma (HCC)     Current Outpatient Medications  Medication Sig Dispense Refill   benzonatate (TESSALON) 200 MG capsule Take 200 mg by mouth 3 (three) times daily as needed.     Calcium Carb-Cholecalciferol (CALCIUM 500/VITAMIN D) 500-3.125 MG-MCG TABS Take by mouth.     simvastatin (ZOCOR) 10 MG tablet TAKE ONE TABLET BY MOUTH AT BEDTIME 90 tablet 0   telmisartan (MICARDIS) 80 MG tablet Take 80 mg by mouth daily.     vitamin B-12 (CYANOCOBALAMIN) 500 MCG tablet Take 500 mcg by mouth daily.     No current facility-administered medications for this visit.    Allergies  Allergen Reactions   Wound Dressing Adhesive Other (See Comments)    Skin tears   Codeine Rash   Steri-Strip Compound Benzoin [Benzoin Compound] Itching, Rash and Other (See Comments)    Skin redness/skin blisters    Family History  Problem Relation Age of Onset   Hypertension Mother    Heart attack Mother    Cancer Maternal Grandfather     Social History   Socioeconomic History   Marital status: Single    Spouse name: Not on file   Number of children: Not on file   Years of education: Not on file   Highest education level: Not on file  Occupational History   Occupation:  Advertising account executive  Tobacco Use   Smoking status: Never   Smokeless tobacco: Never  Vaping Use   Vaping Use: Never used  Substance and Sexual Activity   Alcohol use: Never   Drug use: Never   Sexual activity: Not Currently    Birth control/protection: None  Other Topics Concern   Not on file  Social History Narrative   Not on file   Social Determinants of Health   Financial Resource Strain: Not on file  Food Insecurity: Not on file  Transportation Needs: Not on file  Physical Activity: Not on file  Stress: Not on file  Social Connections: Not on file  Intimate Partner Violence: Not on file     Constitutional: Denies fever, malaise, fatigue, headache or abrupt weight changes.  HEENT: Patient reports ear fullness and nasal congestion.  Denies eye pain, eye redness, ear pain, ringing in the ears, wax buildup, runny nose,bloody nose, or sore throat. Respiratory: Denies difficulty breathing, shortness of breath, cough or sputum production.   Cardiovascular: Denies chest pain, chest tightness, palpitations or swelling in the hands or feet.  Gastrointestinal: Denies abdominal pain, bloating, constipation, diarrhea or blood in the stool.  Neurological: Denies dizziness, difficulty with memory, difficulty with speech or problems with balance and coordination.    No other specific  complaints in a complete review of systems (except as listed in HPI above).  Objective:   Physical Exam  BP 126/85   Pulse 90   Ht 5\' 4"  (1.626 m)   Wt 204 lb (92.5 kg)   SpO2 97%   BMI 35.02 kg/m   Wt Readings from Last 3 Encounters:  01/18/22 204 lb 3.2 oz (92.6 kg)  11/28/21 206 lb 12.8 oz (93.8 kg)  03/22/21 199 lb (90.3 kg)    General: Appears her  stated age, obese, in NAD. Skin: Warm, dry and intact. HEENT: Head: normal shape and size; Eyes: sclera white, no icterus, conjunctiva pink, PERRLA and EOMs intact; Ears: Tm's gray and intact, normal light reflex, + serous effusion bilaterally  L>R; Throat/Mouth: Teeth present, mucosa pink and moist, + PND, no exudate, lesions or ulcerations noted.  Neck:  No adenopathy noted. Cardiovascular: Normal rate and rhythm. S1,S2 noted.  No murmur, rubs or gallops noted.  Pulmonary/Chest: Normal effort and positive vesicular breath sounds. No respiratory distress. No wheezes, rales or ronchi noted.  Neurological: Alert and oriented.  Coordination normal.    BMET    Component Value Date/Time   NA 143 02/26/2022 1314   K 3.9 02/26/2022 1314   CL 109 02/26/2022 1314   CO2 26 02/26/2022 1314   GLUCOSE 117 02/26/2022 1314   BUN 15 02/26/2022 1314   CREATININE 0.74 02/26/2022 1314   CALCIUM 9.1 02/26/2022 1314   GFRNONAA >60 10/10/2018 1238   GFRAA >60 10/10/2018 1238    Lipid Panel     Component Value Date/Time   CHOL 156 02/26/2022 1314   TRIG 128 02/26/2022 1314   HDL 46 (L) 02/26/2022 1314   CHOLHDL 3.4 02/26/2022 1314   LDLCALC 88 02/26/2022 1314    CBC    Component Value Date/Time   WBC 8.1 11/28/2021 1430   RBC 5.15 (H) 11/28/2021 1430   HGB 14.5 11/28/2021 1430   HCT 43.5 11/28/2021 1430   PLT 244 11/28/2021 1430   MCV 84.5 11/28/2021 1430   MCH 28.2 11/28/2021 1430   MCHC 33.3 11/28/2021 1430   RDW 12.6 11/28/2021 1430    Hgb A1C Lab Results  Component Value Date   HGBA1C 6.1 (H) 11/28/2021           Assessment & Plan:   ETD Bilateral:  RX for Pred Taper x 6 days Stop Sudafed and Flonase for now No indication of infection or need for abx at this time  Notify me for new or worsening symptoms, schedule an appt for your annual exam  11/30/2021, NP

## 2022-07-17 ENCOUNTER — Ambulatory Visit: Payer: BC Managed Care – PPO | Admitting: Internal Medicine

## 2022-08-03 ENCOUNTER — Encounter: Payer: Self-pay | Admitting: Internal Medicine

## 2022-08-03 ENCOUNTER — Ambulatory Visit (INDEPENDENT_AMBULATORY_CARE_PROVIDER_SITE_OTHER): Payer: BC Managed Care – PPO | Admitting: Internal Medicine

## 2022-08-03 VITALS — BP 132/84 | HR 81 | Temp 97.3°F | Ht 65.0 in | Wt 206.0 lb

## 2022-08-03 DIAGNOSIS — R7303 Prediabetes: Secondary | ICD-10-CM

## 2022-08-03 DIAGNOSIS — Z23 Encounter for immunization: Secondary | ICD-10-CM | POA: Diagnosis not present

## 2022-08-03 DIAGNOSIS — Z0001 Encounter for general adult medical examination with abnormal findings: Secondary | ICD-10-CM

## 2022-08-03 DIAGNOSIS — Z6834 Body mass index (BMI) 34.0-34.9, adult: Secondary | ICD-10-CM

## 2022-08-03 DIAGNOSIS — E039 Hypothyroidism, unspecified: Secondary | ICD-10-CM

## 2022-08-03 DIAGNOSIS — R0789 Other chest pain: Secondary | ICD-10-CM

## 2022-08-03 DIAGNOSIS — Z1211 Encounter for screening for malignant neoplasm of colon: Secondary | ICD-10-CM

## 2022-08-03 DIAGNOSIS — Z1231 Encounter for screening mammogram for malignant neoplasm of breast: Secondary | ICD-10-CM

## 2022-08-03 DIAGNOSIS — E6609 Other obesity due to excess calories: Secondary | ICD-10-CM

## 2022-08-03 DIAGNOSIS — Z1159 Encounter for screening for other viral diseases: Secondary | ICD-10-CM

## 2022-08-03 MED ORDER — TELMISARTAN 80 MG PO TABS: 80.0000 mg | ORAL_TABLET | Freq: Every day | ORAL | 1 refills | Status: DC

## 2022-08-03 MED ORDER — SIMVASTATIN 10 MG PO TABS: 10.0000 mg | ORAL_TABLET | Freq: Every day | ORAL | 1 refills | Status: DC

## 2022-08-03 NOTE — Patient Instructions (Signed)

## 2022-08-03 NOTE — Assessment & Plan Note (Signed)
Encourage diet and exercise for weight loss 

## 2022-08-03 NOTE — Progress Notes (Unsigned)
Subjective:    Patient ID: Dana Figueroa, female    DOB: 21-Sep-1976, 46 y.o.   MRN: [id number]  HPI  Patient presents to clinic today for her annual exam.  Flu: 11/2021 Tetanus: 09/2018 COVID: Moderna x2 Pap smear: 03/2021 Mammogram: 07/2021, [facility] Colon screening: never Vision screening: annually Dentist: biannually  Diet: She does eat meat. She consumes fruits and veggies. She tries to avoid fried foods. She drinks mostly water, 1 can of soda, juice Exercise: Stationary bike 3 x week  Review of Systems     Past Medical History:  Diagnosis Date   Headache    migraines   History of kidney stones    Hypothyroidism    Ovarian cyst    Scleroderma (HCC)     Current Outpatient Medications  Medication Sig Dispense Refill   benzonatate (TESSALON) 200 MG capsule Take 200 mg by mouth 3 (three) times daily as needed.     Calcium Carb-Cholecalciferol (CALCIUM 500/VITAMIN D) 500-3.125 MG-MCG TABS Take by mouth.     predniSONE (DELTASONE) 10 MG tablet Take 6 tabs on day 1, 5 tabs on day 2, 4 tabs on day 3, 3 tabs on day 4, 2 tabs on day 5, 1 tab on day 6 21 tablet 0   simvastatin (ZOCOR) 10 MG tablet TAKE ONE TABLET BY MOUTH AT BEDTIME 90 tablet 0   telmisartan (MICARDIS) 80 MG tablet Take 80 mg by mouth daily.     vitamin B-12 (CYANOCOBALAMIN) 500 MCG tablet Take 500 mcg by mouth daily.     No current facility-administered medications for this visit.    Allergies  Allergen Reactions   Wound Dressing Adhesive Other (See Comments)    Skin tears   Codeine Rash   Steri-Strip Compound Benzoin [Benzoin Compound] Itching, Rash and Other (See Comments)    Skin redness/skin blisters    Family History  Problem Relation Age of Onset   Hypertension Mother    Heart attack Mother    Cancer Maternal Grandfather     Social History   Socioeconomic History   Marital status: Single    Spouse name: Not on file   Number of children: Not on file   Years of education: Not on file    Highest education level: Not on file  Occupational History   Occupation: Chiropractor  Tobacco Use   Smoking status: Never   Smokeless tobacco: Never  Vaping Use   Vaping Use: Never used  Substance and Sexual Activity   Alcohol use: Never   Drug use: Never   Sexual activity: Not Currently    Birth control/protection: None  Other Topics Concern   Not on file  Social History Narrative   Not on file   Social Determinants of Health   Financial Resource Strain: Not on file  Food Insecurity: Not on file  Transportation Needs: Not on file  Physical Activity: Not on file  Stress: Not on file  Social Connections: Not on file  Intimate Partner Violence: Not on file     Constitutional: Patient reports intermittent headaches.  Denies fever, malaise, fatigue, or abrupt weight changes.  HEENT: Denies eye pain, eye redness, ear pain, ringing in the ears, wax buildup, runny nose, nasal congestion, bloody nose, or sore throat. Respiratory: Denies difficulty breathing, shortness of breath, cough or sputum production.   Cardiovascular: Denies chest pain, chest tightness, palpitations or swelling in the hands or feet.  Gastrointestinal: Denies abdominal pain, bloating, constipation, diarrhea or blood in the stool.  GU: Denies urgency, frequency, pain with urination, burning sensation, blood in urine, odor or discharge. Musculoskeletal: Pt reports right side chest wall pain, chronic right foot swelling. Denies decrease in range of motion, difficulty with gait, or joint pain.  Skin: Denies redness, rashes, lesions or ulcercations.  Neurological: Denies dizziness, difficulty with memory, difficulty with speech or problems with balance and coordination.  Psych: Denies anxiety, depression, SI/HI.  No other specific complaints in a complete review of systems (except as listed in HPI above).  Objective:   Physical Exam   BP 132/84 (BP Location: Left Arm, Patient Position: Sitting, Cuff Size:  Normal)   Pulse 81   Temp (!) 97.3 F (36.3 C) (Temporal)   Ht 5' 5"  (1.651 m)   Wt 206 lb (93.4 kg)   SpO2 99%   BMI 34.28 kg/m   Wt Readings from Last 3 Encounters:  07/13/22 204 lb (92.5 kg)  01/18/22 204 lb 3.2 oz (92.6 kg)  11/28/21 206 lb 12.8 oz (93.8 kg)    General: Appears her stated age, obese, in NAD. Skin: Warm, dry and intact. HEENT: Head: normal shape and size; Eyes: sclera white, no icterus, conjunctiva pink, PERRLA and EOMs intact;  Neck:  Neck supple, trachea midline. No masses, lumps or thyromegaly present.  Cardiovascular: Normal rate and rhythm. S1,S2 noted.  No murmur, rubs or gallops noted. No JVD or BLE edema.  Pulmonary/Chest: Normal effort and positive vesicular breath sounds. No respiratory distress. No wheezes, rales or ronchi noted.  Abdomen: Normal bowel sounds.  Musculoskeletal: Pain with palpation of the right upper chest wall.  Strength 5/5 BUE/BLE.  No difficulty with gait.  Neurological: Alert and oriented. Cranial nerves II-XII grossly intact. Coordination normal.  Psychiatric: Mood and affect normal. Behavior is normal. Judgment and thought content normal.    BMET    Component Value Date/Time   NA 143 02/26/2022 1314   K 3.9 02/26/2022 1314   CL 109 02/26/2022 1314   CO2 26 02/26/2022 1314   GLUCOSE 117 02/26/2022 1314   BUN 15 02/26/2022 1314   CREATININE 0.74 02/26/2022 1314   CALCIUM 9.1 02/26/2022 1314   GFRNONAA >60 10/10/2018 1238   GFRAA >60 10/10/2018 1238    Lipid Panel     Component Value Date/Time   CHOL 156 02/26/2022 1314   TRIG 128 02/26/2022 1314   HDL 46 (L) 02/26/2022 1314   CHOLHDL 3.4 02/26/2022 1314   LDLCALC 88 02/26/2022 1314    CBC    Component Value Date/Time   WBC 8.1 11/28/2021 1430   RBC 5.15 (H) 11/28/2021 1430   HGB 14.5 11/28/2021 1430   HCT 43.5 11/28/2021 1430   PLT 244 11/28/2021 1430   MCV 84.5 11/28/2021 1430   MCH 28.2 11/28/2021 1430   MCHC 33.3 11/28/2021 1430   RDW 12.6  11/28/2021 1430    Hgb A1C Lab Results  Component Value Date   HGBA1C 6.1 (H) 11/28/2021           Assessment & Plan:   Preventative Health Maintenance:  Flu shot today Tetanus UTD Encouraged her to get her COVID booster Pap smear UTD Mammogram ordered-she will call to schedule Referral to GI for screening colonoscopy Encouraged her to consume a balanced diet and exercise regimen Advised her to see an eye doctor and dentist annually We will check CBC, c-Met, TSH, free T4, lipid, A1c and hep C today  Musculoskeletal Chest Wall Pain:  Encouraged rest, heat and stretching Can take Ibuprofen 400 mg  3 times daily as needed with food  RTC in 6 months, follow-up chronic conditions Webb Silversmith, NP

## 2022-08-06 DIAGNOSIS — Z23 Encounter for immunization: Secondary | ICD-10-CM | POA: Diagnosis not present

## 2022-08-06 LAB — CBC
HCT: 40.6 % (ref 35.0–45.0)
Hemoglobin: 13.4 g/dL (ref 11.7–15.5)
MCH: 28.8 pg (ref 27.0–33.0)
MCHC: 33 g/dL (ref 32.0–36.0)
MCV: 87.1 fL (ref 80.0–100.0)
MPV: 11.3 fL (ref 7.5–12.5)
Platelets: 190 10*3/uL (ref 140–400)
RBC: 4.66 10*6/uL (ref 3.80–5.10)
RDW: 12.5 % (ref 11.0–15.0)
WBC: 5.8 10*3/uL (ref 3.8–10.8)

## 2022-08-06 LAB — COMPLETE METABOLIC PANEL WITH GFR
AG Ratio: 1.7 (calc) (ref 1.0–2.5)
ALT: 38 U/L — ABNORMAL HIGH (ref 6–29)
AST: 23 U/L (ref 10–35)
Albumin: 4 g/dL (ref 3.6–5.1)
Alkaline phosphatase (APISO): 88 U/L (ref 31–125)
BUN: 15 mg/dL (ref 7–25)
CO2: 28 mmol/L (ref 20–32)
Calcium: 9 mg/dL (ref 8.6–10.2)
Chloride: 109 mmol/L (ref 98–110)
Creat: 0.54 mg/dL (ref 0.50–0.99)
Globulin: 2.4 g/dL (calc) (ref 1.9–3.7)
Glucose, Bld: 95 mg/dL (ref 65–99)
Potassium: 4.1 mmol/L (ref 3.5–5.3)
Sodium: 143 mmol/L (ref 135–146)
Total Bilirubin: 0.5 mg/dL (ref 0.2–1.2)
Total Protein: 6.4 g/dL (ref 6.1–8.1)
eGFR: 113 mL/min/{1.73_m2} (ref >=60)

## 2022-08-06 LAB — TSH+FREE T4: TSH W/REFLEX TO FT4: 5.57 mIU/L — ABNORMAL HIGH

## 2022-08-06 LAB — HEPATITIS C ANTIBODY: Hepatitis C Ab: NONREACTIVE

## 2022-08-06 LAB — LIPID PANEL
Cholesterol: 132 mg/dL (ref <200)
HDL: 35 mg/dL — ABNORMAL LOW (ref >=50)
LDL Cholesterol (Calc): 81 mg/dL (calc)
Non-HDL Cholesterol (Calc): 97 mg/dL (calc) (ref <130)
Total CHOL/HDL Ratio: 3.8 (calc) (ref <5.0)
Triglycerides: 81 mg/dL (ref <150)

## 2022-08-06 LAB — HEMOGLOBIN A1C
Hgb A1c MFr Bld: 6.1 % of total Hgb — ABNORMAL HIGH (ref <5.7)
Mean Plasma Glucose: 128 mg/dL
eAG (mmol/L): 7.1 mmol/L

## 2022-08-06 LAB — T4, FREE: Free T4: 0.9 ng/dL (ref 0.8–1.8)

## 2022-08-07 ENCOUNTER — Telehealth: Payer: Self-pay | Admitting: *Deleted

## 2022-08-07 ENCOUNTER — Other Ambulatory Visit: Payer: Self-pay | Admitting: *Deleted

## 2022-08-07 DIAGNOSIS — Z1211 Encounter for screening for malignant neoplasm of colon: Secondary | ICD-10-CM

## 2022-08-07 MED ORDER — NA SULFATE-K SULFATE-MG SULF 17.5-3.13-1.6 GM/177ML PO SOLN: 1.0000 | Freq: Once | ORAL | 0 refills | Status: AC

## 2022-08-07 NOTE — Telephone Encounter (Signed)
Gastroenterology Pre-Procedure Review  Request Date: 09/25/2022 Requesting Physician: Dr. Vicente Males  PATIENT REVIEW QUESTIONS: The patient responded to the following health history questions as indicated:    1. Are you having any GI issues? no 2. Do you have a personal history of Polyps? no 3. Do you have a family history of Colon Cancer or Polyps? no 4. Diabetes Mellitus? no 5. Joint replacements in the past 12 months?no 6. Major health problems in the past 3 months?no 7. Any artificial heart valves, MVP, or defibrillator?no    MEDICATIONS & ALLERGIES:    Patient reports the following regarding taking any anticoagulation/antiplatelet therapy:   Plavix, Coumadin, Eliquis, Xarelto, Lovenox, Pradaxa, Brilinta, or Effient? no Aspirin? no  Patient confirms/reports the following medications:  Current Outpatient Medications  Medication Sig Dispense Refill   Calcium Carb-Cholecalciferol (CALCIUM 500/VITAMIN D) 500-3.125 MG-MCG TABS Take by mouth.     simvastatin (ZOCOR) 10 MG tablet Take 1 tablet (10 mg total) by mouth at bedtime. 90 tablet 1   telmisartan (MICARDIS) 80 MG tablet Take 1 tablet (80 mg total) by mouth daily. 90 tablet 1   No current facility-administered medications for this visit.    Patient confirms/reports the following allergies:  Allergies  Allergen Reactions   Wound Dressing Adhesive Other (See Comments)    Skin tears   Codeine Rash   Steri-Strip Compound Benzoin [Benzoin Compound] Itching, Rash and Other (See Comments)    Skin redness/skin blisters    No orders of the defined types were placed in this encounter.   AUTHORIZATION INFORMATION Primary Insurance: 1D#: Group #:  Secondary Insurance: 1D#: Group #:  SCHEDULE INFORMATION: Date: 09/25/2022 Time: Location: [facility]

## 2022-09-04 ENCOUNTER — Ambulatory Visit
Admission: RE | Admit: 2022-09-04 | Discharge: 2022-09-04 | Disposition: A | Discharge status: Home / Self Care | Payer: BC Managed Care – PPO | Source: Ambulatory Visit | Attending: Internal Medicine | Admitting: Internal Medicine

## 2022-09-04 DIAGNOSIS — Z1231 Encounter for screening mammogram for malignant neoplasm of breast: Secondary | ICD-10-CM | POA: Diagnosis present

## 2022-09-04 DIAGNOSIS — Z0001 Encounter for general adult medical examination with abnormal findings: Secondary | ICD-10-CM | POA: Insufficient documentation

## 2022-09-04 HISTORY — DX: Other specified postprocedural states: Z98.890

## 2022-09-10 ENCOUNTER — Inpatient Hospital Stay
Admission: RE | Admit: 2022-09-10 | Discharge: 2022-09-10 | Disposition: A | Discharge status: Home / Self Care | Payer: Self-pay | Source: Ambulatory Visit | Attending: *Deleted | Admitting: *Deleted

## 2022-09-10 ENCOUNTER — Other Ambulatory Visit: Payer: Self-pay | Admitting: *Deleted

## 2022-09-10 DIAGNOSIS — Z1231 Encounter for screening mammogram for malignant neoplasm of breast: Secondary | ICD-10-CM

## 2022-09-20 ENCOUNTER — Encounter: Payer: Self-pay | Admitting: Internal Medicine

## 2022-09-20 ENCOUNTER — Ambulatory Visit: Payer: BC Managed Care – PPO | Admitting: Internal Medicine

## 2022-09-20 VITALS — BP 106/72 | HR 92 | Temp 96.9°F | Wt 207.0 lb

## 2022-09-20 DIAGNOSIS — R058 Other specified cough: Secondary | ICD-10-CM

## 2022-09-20 DIAGNOSIS — R490 Dysphonia: Secondary | ICD-10-CM

## 2022-09-20 MED ORDER — OMEPRAZOLE 40 MG PO CPDR: 40.0000 mg | DELAYED_RELEASE_CAPSULE | Freq: Every day | ORAL | 0 refills | Status: DC

## 2022-09-20 MED ORDER — CETIRIZINE HCL 10 MG PO TABS: 10.0000 mg | ORAL_TABLET | Freq: Every day | ORAL | 0 refills | Status: DC

## 2022-09-20 NOTE — Patient Instructions (Signed)

## 2022-09-20 NOTE — Progress Notes (Signed)
Subjective:    Patient ID: Joesph Fillers, female    DOB: 26-Feb-1976, 46 y.o.   MRN: [id number]  HPI  Patient presents to clinic today with complaint of throat tightness and cough.  This started about 2 months ago.  She reports her symptoms only occur when she talks or lays down. The cough is non productive. She denies headache, runny nose, nasal congestion, ear pain, sore throat, shortness of breath, chest pain, nausea, vomiting, or abdominal pain. She has tried Prilosec OTC, cough syrup. She has no prior history of GERD or asthma. She does not smoke.  Review of Systems     Past Medical History:  Diagnosis Date   Headache    migraines   History of kidney stones    History of left breast biopsy    Hypothyroidism    Ovarian cyst    Scleroderma (HCC)     Current Outpatient Medications  Medication Sig Dispense Refill   Calcium Carb-Cholecalciferol (CALCIUM 500/VITAMIN D) 500-3.125 MG-MCG TABS Take by mouth.     simvastatin (ZOCOR) 10 MG tablet Take 1 tablet (10 mg total) by mouth at bedtime. 90 tablet 1   telmisartan (MICARDIS) 80 MG tablet Take 1 tablet (80 mg total) by mouth daily. 90 tablet 1   No current facility-administered medications for this visit.    Allergies  Allergen Reactions   Wound Dressing Adhesive Other (See Comments)    Skin tears   Codeine Rash   Steri-Strip Compound Benzoin [Benzoin Compound] Itching, Rash and Other (See Comments)    Skin redness/skin blisters    Family History  Problem Relation Age of Onset   Hypertension Mother    Heart attack Mother    Cancer Maternal Grandfather     Social History   Socioeconomic History   Marital status: Single    Spouse name: Not on file   Number of children: Not on file   Years of education: Not on file   Highest education level: Not on file  Occupational History   Occupation: Advertising account executive  Tobacco Use   Smoking status: Never   Smokeless tobacco: Never  Vaping Use   Vaping Use: Never used   Substance and Sexual Activity   Alcohol use: Never   Drug use: Never   Sexual activity: Not Currently    Birth control/protection: None  Other Topics Concern   Not on file  Social History Narrative   Not on file   Social Determinants of Health   Financial Resource Strain: Not on file  Food Insecurity: Not on file  Transportation Needs: Not on file  Physical Activity: Not on file  Stress: Not on file  Social Connections: Not on file  Intimate Partner Violence: Not on file     Constitutional: Denies fever, malaise, fatigue, headache or abrupt weight changes.  HEENT: Patient reports throat tightness.  Denies eye pain, eye redness, ear pain, ringing in the ears, wax buildup, runny nose, nasal congestion, bloody nose. Respiratory: Patient reports cough.  Denies difficulty breathing, shortness of breath, or sputum production.   Cardiovascular: Denies chest pain, chest tightness, palpitations or swelling in the hands or feet.  Gastrointestinal: Denies abdominal pain, bloating, constipation, diarrhea or blood in the stool.   No other specific complaints in a complete review of systems (except as listed in HPI above).  Objective:   Physical Exam   BP 106/72 (BP Location: Left Arm, Patient Position: Sitting, Cuff Size: Normal)   Pulse 92   Temp (!) 96.9  F (36.1 C) (Temporal)   Wt 207 lb (93.9 kg)   SpO2 99%   BMI 34.45 kg/m   Wt Readings from Last 3 Encounters:  08/03/22 206 lb (93.4 kg)  07/13/22 204 lb (92.5 kg)  01/18/22 204 lb 3.2 oz (92.6 kg)    General: Appears her stated age, obese, in NAD. Skin: Warm, dry and intact.  HEENT: Head: normal shape and size; Eyes: sclera white, no icterus, conjunctiva pink, PERRLA and EOMs intact; Throat/Mouth: Teeth present, mucosa pink and moist, + PND, no exudate, lesions or ulcerations noted.  Neck: No adenopathy noted. Cardiovascular: Normal rate and rhythm. S1,S2 noted.  No murmur, rubs or gallops noted.  Pulmonary/Chest:  Normal effort and positive vesicular breath sounds. No respiratory distress. No wheezes, rales or ronchi noted.  Abdomen: Soft and nontender.  Musculoskeletal: No difficulty with gait.  Neurological: Alert and oriented.   BMET    Component Value Date/Time   NA 143 08/03/2022 0859   K 4.1 08/03/2022 0859   CL 109 08/03/2022 0859   CO2 28 08/03/2022 0859   GLUCOSE 95 08/03/2022 0859   BUN 15 08/03/2022 0859   CREATININE 0.54 08/03/2022 0859   CALCIUM 9.0 08/03/2022 0859   GFRNONAA >60 10/10/2018 1238   GFRAA >60 10/10/2018 1238    Lipid Panel     Component Value Date/Time   CHOL 132 08/03/2022 0859   TRIG 81 08/03/2022 0859   HDL 35 (L) 08/03/2022 0859   CHOLHDL 3.8 08/03/2022 0859   LDLCALC 81 08/03/2022 0859    CBC    Component Value Date/Time   WBC 5.8 08/03/2022 0859   RBC 4.66 08/03/2022 0859   HGB 13.4 08/03/2022 0859   HCT 40.6 08/03/2022 0859   PLT 190 08/03/2022 0859   MCV 87.1 08/03/2022 0859   MCH 28.8 08/03/2022 0859   MCHC 33.0 08/03/2022 0859   RDW 12.5 08/03/2022 0859    Hgb A1C Lab Results  Component Value Date   HGBA1C 6.1 (H) 08/03/2022           Assessment & Plan:   Cough:  DDx include postnasal drip, silent reflux, chronic cough Rx for Zyrtec 10 mg every morning We will increase Omeprazole to 40 mg nightly She decided to hold off on Albuterol inhaler at this time but if cough persists would recommend this versus Flovent Consider referral to ENT versus pulmonology if symptoms persist or worsen  RTC in 4 months for follow-up of chronic conditions Nicki Reaper, NP

## 2022-09-25 ENCOUNTER — Encounter: Admission: RE | Payer: Self-pay | Source: Ambulatory Visit

## 2022-09-25 ENCOUNTER — Ambulatory Visit
Admission: RE | Admit: 2022-09-25 | Payer: BC Managed Care – PPO | Source: Ambulatory Visit | Admitting: Gastroenterology

## 2022-09-25 SURGERY — COLONOSCOPY WITH PROPOFOL
Anesthesia: General

## 2022-10-12 ENCOUNTER — Encounter: Payer: Self-pay | Admitting: Internal Medicine

## 2022-10-15 ENCOUNTER — Encounter: Payer: Self-pay | Admitting: Internal Medicine

## 2022-10-16 ENCOUNTER — Ambulatory Visit
Admission: RE | Admit: 2022-10-16 | Discharge: 2022-10-16 | Disposition: A | Discharge status: Home / Self Care | Payer: BC Managed Care – PPO | Source: Ambulatory Visit | Attending: Internal Medicine | Admitting: Internal Medicine

## 2022-10-16 ENCOUNTER — Ambulatory Visit
Admission: RE | Admit: 2022-10-16 | Discharge: 2022-10-16 | Disposition: A | Discharge status: Home / Self Care | Payer: BC Managed Care – PPO | Attending: Internal Medicine | Admitting: Internal Medicine

## 2022-10-16 ENCOUNTER — Ambulatory Visit: Payer: BC Managed Care – PPO | Admitting: Internal Medicine

## 2022-10-16 ENCOUNTER — Encounter: Payer: Self-pay | Admitting: Internal Medicine

## 2022-10-16 VITALS — BP 110/68 | HR 98 | Resp 18 | Wt 207.0 lb

## 2022-10-16 DIAGNOSIS — R053 Chronic cough: Secondary | ICD-10-CM

## 2022-10-16 MED ORDER — AMLODIPINE BESYLATE 10 MG PO TABS: 10.0000 mg | ORAL_TABLET | Freq: Every day | ORAL | 0 refills | Status: DC

## 2022-10-16 MED ORDER — ALBUTEROL SULFATE HFA 108 (90 BASE) MCG/ACT IN AERS: 2.0000 | INHALATION_SPRAY | Freq: Four times a day (QID) | RESPIRATORY_TRACT | 0 refills | Status: DC | PRN

## 2022-10-16 NOTE — Progress Notes (Signed)
Subjective:    Patient ID: Dana Figueroa, female    DOB: 1976-10-05, 46 y.o.   MRN: [id number]  HPI  Patient presents to clinic today with complaint of persistent cough.  This started 3 months ago.  She was seen 1 month ago for the same.  She was started on Zyrtec 10 mg daily.  Her Omeprazole was increased to 40 mg daily.  No chest x-ray was obtained at that time.  She reports her cough seemed to improve initially however it seems to be the same as it was before.  The cough is dry nonproductive.  She denies runny nose, nasal congestion, ear pain, sore throat or shortness of breath.  She is on Telmisartan which she is taking as prescribed.  She has never had any allergy testing.  Review of Systems     Past Medical History:  Diagnosis Date   Headache    migraines   History of kidney stones    History of left breast biopsy    Hypothyroidism    Ovarian cyst    Scleroderma (HCC)     Current Outpatient Medications  Medication Sig Dispense Refill   Calcium Carb-Cholecalciferol (CALCIUM 500/VITAMIN D) 500-3.125 MG-MCG TABS Take by mouth.     cetirizine (ZYRTEC) 10 MG tablet Take 1 tablet (10 mg total) by mouth daily. 90 tablet 0   omeprazole (PRILOSEC) 40 MG capsule Take 1 capsule (40 mg total) by mouth daily. 90 capsule 0   simvastatin (ZOCOR) 10 MG tablet Take 1 tablet (10 mg total) by mouth at bedtime. 90 tablet 1   telmisartan (MICARDIS) 80 MG tablet Take 1 tablet (80 mg total) by mouth daily. 90 tablet 1   No current facility-administered medications for this visit.    Allergies  Allergen Reactions   Wound Dressing Adhesive Other (See Comments)    Skin tears   Codeine Rash   Steri-Strip Compound Benzoin [Benzoin Compound] Itching, Rash and Other (See Comments)    Skin redness/skin blisters    Family History  Problem Relation Age of Onset   Hypertension Mother    Heart attack Mother    Cancer Maternal Grandfather     Social History   Socioeconomic History    Marital status: Single    Spouse name: Not on file   Number of children: Not on file   Years of education: Not on file   Highest education level: Not on file  Occupational History   Occupation: Advertising account executive  Tobacco Use   Smoking status: Never   Smokeless tobacco: Never  Vaping Use   Vaping Use: Never used  Substance and Sexual Activity   Alcohol use: Never   Drug use: Never   Sexual activity: Not Currently    Birth control/protection: None  Other Topics Concern   Not on file  Social History Narrative   Not on file   Social Determinants of Health   Financial Resource Strain: Not on file  Food Insecurity: Not on file  Transportation Needs: Not on file  Physical Activity: Not on file  Stress: Not on file  Social Connections: Not on file  Intimate Partner Violence: Not on file     Constitutional: Denies fever, malaise, fatigue, headache or abrupt weight changes.  HEENT: Denies eye pain, eye redness, ear pain, ringing in the ears, wax buildup, runny nose, nasal congestion, bloody nose, or sore throat. Respiratory: Patient reports cough.  Denies difficulty breathing, shortness of breath, or sputum production.   Cardiovascular: Denies chest  pain, chest tightness, palpitations or swelling in the hands or feet.  Gastrointestinal: Denies abdominal pain, bloating, constipation, diarrhea or blood in the stool.   No other specific complaints in a complete review of systems (except as listed in HPI above).  Objective:   Physical Exam  BP 110/68   Pulse 98   Resp 18   Wt 207 lb (93.9 kg)   SpO2 100%   BMI 34.45 kg/m   Wt Readings from Last 3 Encounters:  09/20/22 207 lb (93.9 kg)  08/03/22 206 lb (93.4 kg)  07/13/22 204 lb (92.5 kg)    General: Appears her stated age, obese, in NAD. HEENT: Head: normal shape and size; Eyes: sclera white, no icterus, conjunctiva pink, PERRLA and EOMs intact; Cardiovascular: Normal rate and rhythm. S1,S2 noted.  No murmur, rubs or  gallops noted.  Pulmonary/Chest: Normal effort and positive vesicular breath sounds. No respiratory distress. No wheezes, rales or ronchi noted.  Neurological: Alert and oriented.    BMET    Component Value Date/Time   NA 143 08/03/2022 0859   K 4.1 08/03/2022 0859   CL 109 08/03/2022 0859   CO2 28 08/03/2022 0859   GLUCOSE 95 08/03/2022 0859   BUN 15 08/03/2022 0859   CREATININE 0.54 08/03/2022 0859   CALCIUM 9.0 08/03/2022 0859   GFRNONAA >60 10/10/2018 1238   GFRAA >60 10/10/2018 1238    Lipid Panel     Component Value Date/Time   CHOL 132 08/03/2022 0859   TRIG 81 08/03/2022 0859   HDL 35 (L) 08/03/2022 0859   CHOLHDL 3.8 08/03/2022 0859   LDLCALC 81 08/03/2022 0859    CBC    Component Value Date/Time   WBC 5.8 08/03/2022 0859   RBC 4.66 08/03/2022 0859   HGB 13.4 08/03/2022 0859   HCT 40.6 08/03/2022 0859   PLT 190 08/03/2022 0859   MCV 87.1 08/03/2022 0859   MCH 28.8 08/03/2022 0859   MCHC 33.0 08/03/2022 0859   RDW 12.5 08/03/2022 0859    Hgb A1C Lab Results  Component Value Date   HGBA1C 6.1 (H) 08/03/2022           Assessment & Plan:  Chronic Cough:  ?  Related to Telmisartan, will DC Rx for Amlodipine 10 mg daily Chest x-ray today Rx for Albuterol inhaler 1 to 2 puffs every 4-6 hours as needed Referral for allergy testing Continue Zyrtec and Omeprazole as previously prescribed   RTC in 2 weeks for nurse visit BP check, 3 months for follow-up of chronic conditions Webb Silversmith, NP

## 2022-10-16 NOTE — Patient Instructions (Signed)

## 2022-10-30 ENCOUNTER — Ambulatory Visit: Payer: BC Managed Care – PPO

## 2022-10-30 NOTE — Progress Notes (Unsigned)
Hypertension, follow-up  BP Readings from Last 3 Encounters:  10/16/22 110/68  09/20/22 106/72  08/03/22 132/84   Wt Readings from Last 3 Encounters:  10/16/22 207 lb (93.9 kg)  09/20/22 207 lb (93.9 kg)  08/03/22 206 lb (93.4 kg)     She was last seen for hypertension 10/16/2022 BP at that visit was 110/68. Management since that visit includes stopping telmisartan to amlodipine due to chronic cough.  She reports excellent compliance with treatment. She is not having side effects.   ---------------------------------------------------------------------------------------------------   Left arm: 114/75

## 2022-11-17 IMAGING — US US PELVIS COMPLETE WITH TRANSVAGINAL
1 series · 13 of 25 positions shown · non-contrast
Comparison: None

CLINICAL DATA: Abnormal uterine bleeding.



[Series 1: us pelvis complete with transvaginal · 0.26mm/px · 13 of 129 slices shown]
[im 1/129]
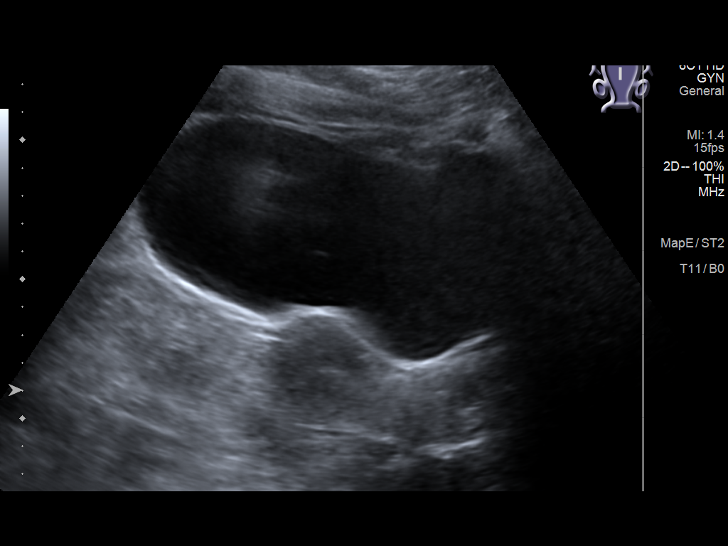
[im 11/129]
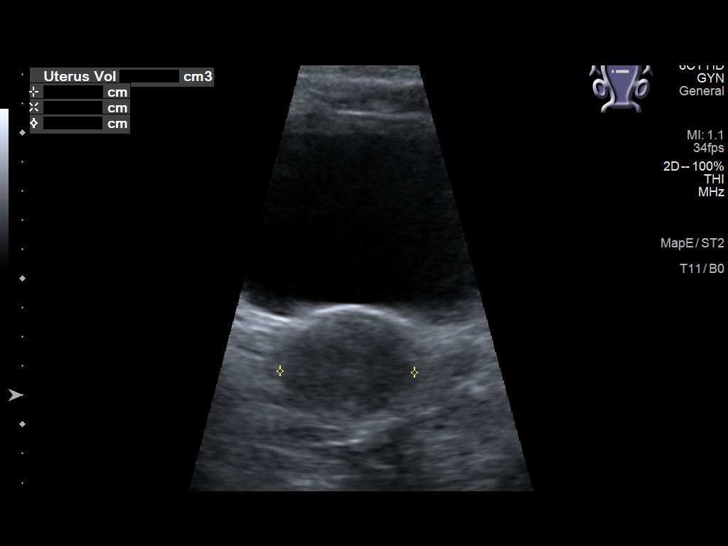
[im 22/129]
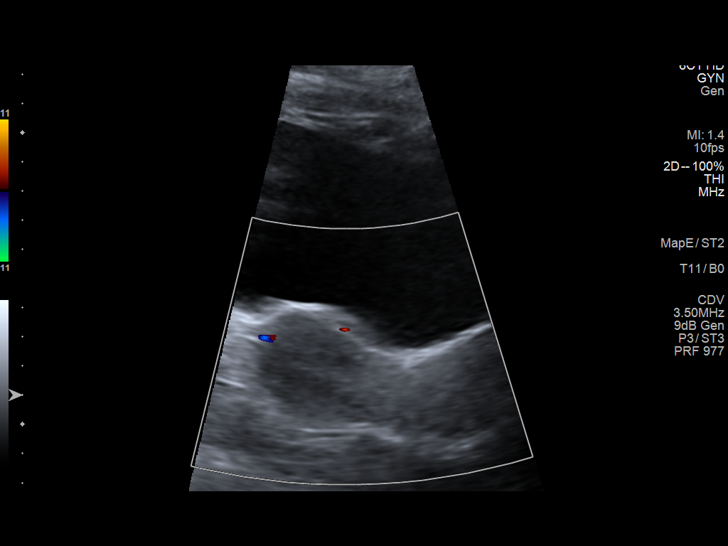
[im 33/129]
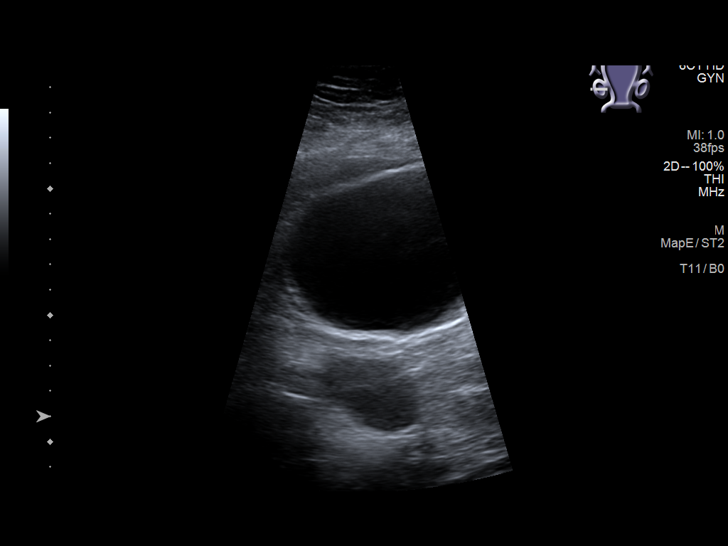
[im 43/129]
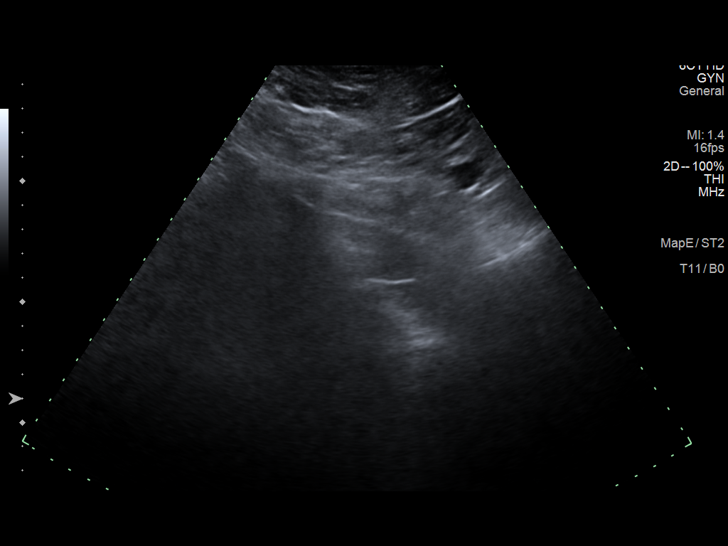
[im 54/129]
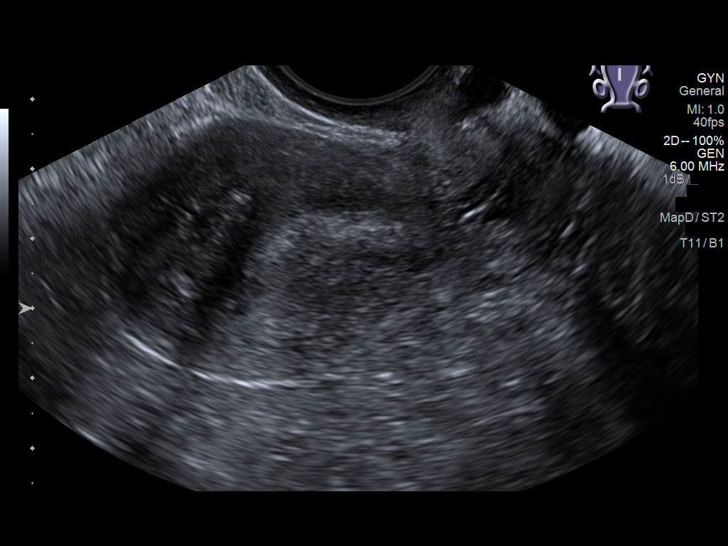
[im 65/129]
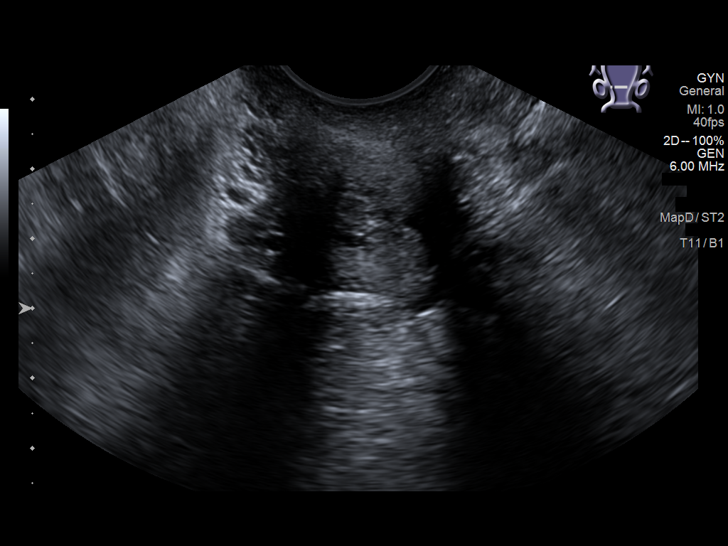
[im 75/129]
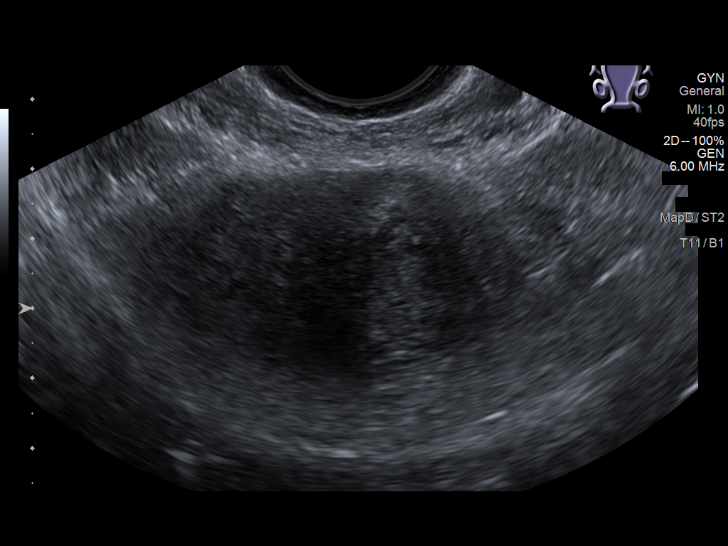
[im 86/129]
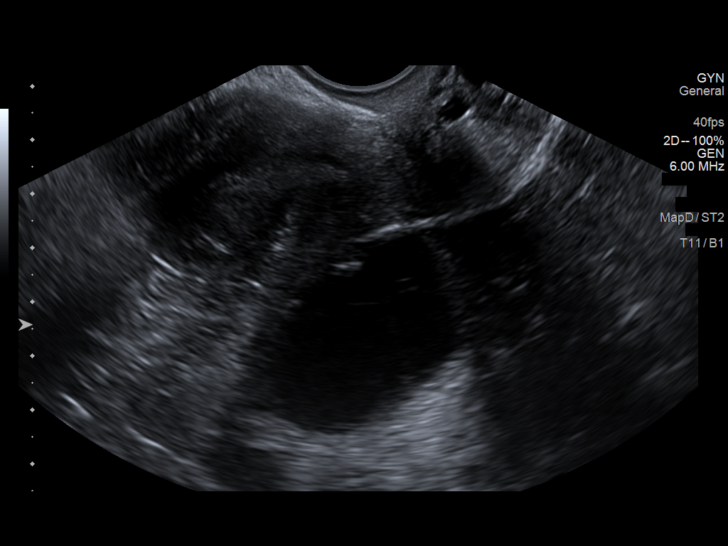
[im 97/129]
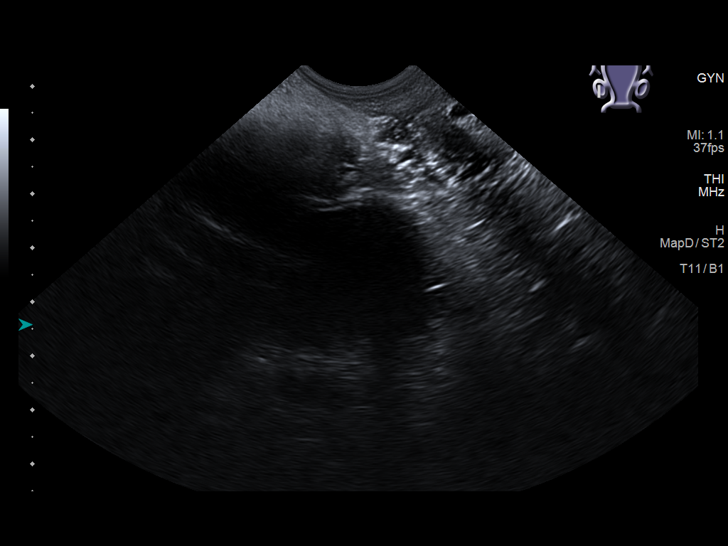
[im 107/129]
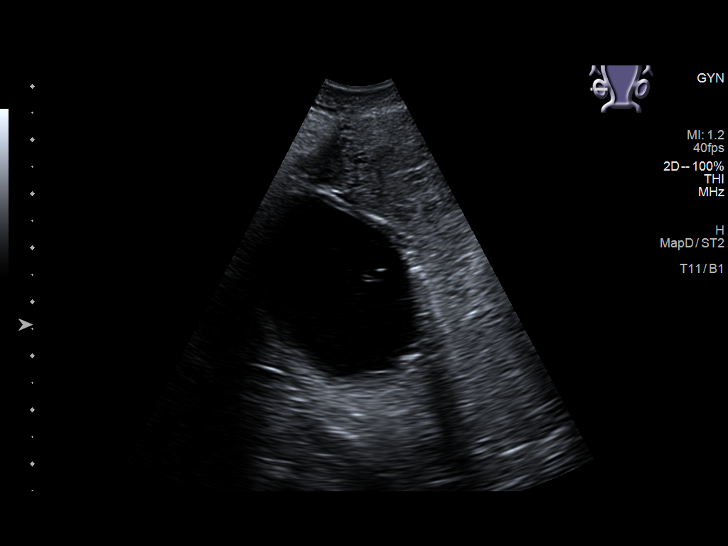
[im 118/129]
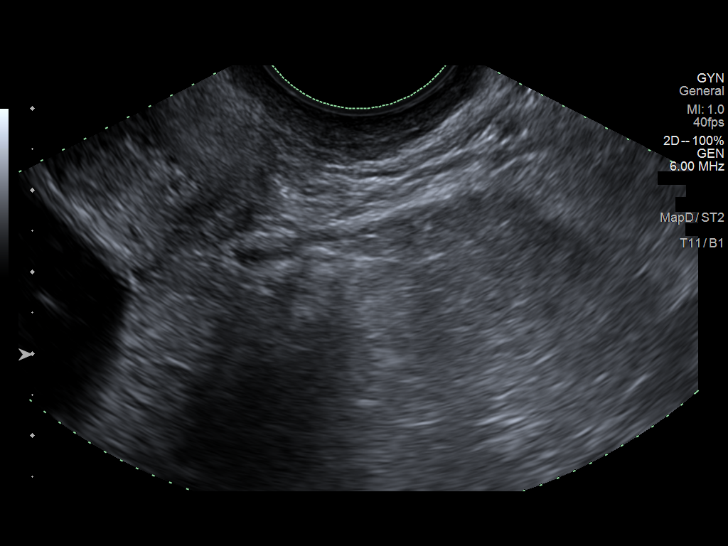
[im 129/129]
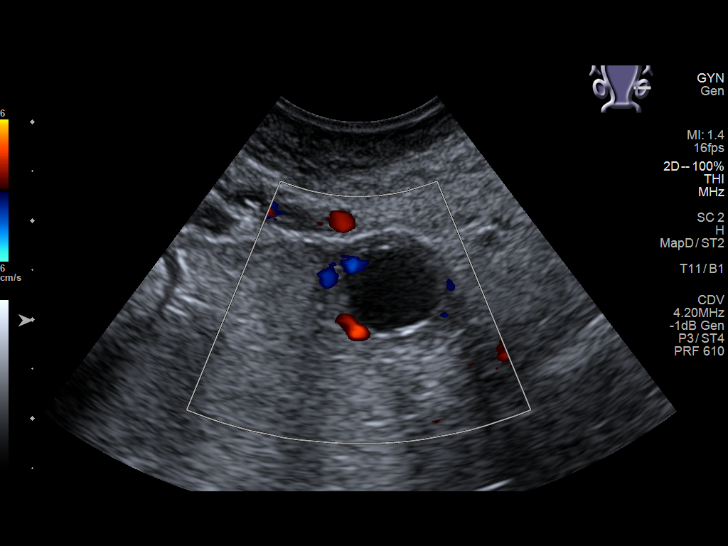

[13 of 25 positions shown; findings below may reference images not displayed]

FINDINGS: Uterus

Measurements: 7.8 x 3.8 x 5.5 cm = volume: 86 mL. Multiple fibroids
evident measuring up to 3.0 cm.

Endometrium

Thickness: 6 mm.  Focal thickening noted towards the fundal region.

Right ovary

Measurements: 5.5 x 3.4 x 3.0 cm = volume: 29. ML. 5.5 cm benign
appearing simple cyst

Left ovary

Measurements: 2.6 x 1.4 x 1.9 cm = volume: For mL. Normal
appearance/no adnexal mass.

Other findings

No abnormal free fluid.
IMPRESSION: 1. Multiple uterine fibroids with possible focal thickening of the
endometrium near the fundal region. Sonohysterogram may prove
helpful to further evaluate screw
2. 5.5 cm benign appearing simple cyst in the left ovary. At this
size, consensus criteria recommend follow-up ultrasound in 6-12
weeks. This recommendation follows the consensus statement:
Management of Asymptomatic Ovarian and Other Adnexal Cysts Imaged at
US: Society of Radiologists in Ultrasound Consensus Conference

## 2022-12-10 IMAGING — US US PELVIS COMPLETE WITH TRANSVAGINAL
1 series · 13 of 25 positions shown · non-contrast
Comparison: 05/09/2021

CLINICAL DATA: RIGHT ovarian cyst.  LMP 05/29/2021.



[Series 1: us pelvis complete with transvaginal · 0.24mm/px · 13 of 125 slices shown]
[im 1/125]
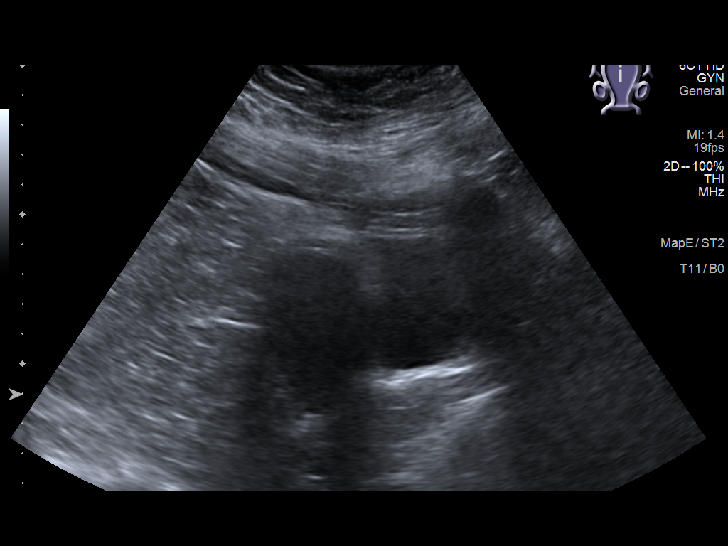
[im 11/125]
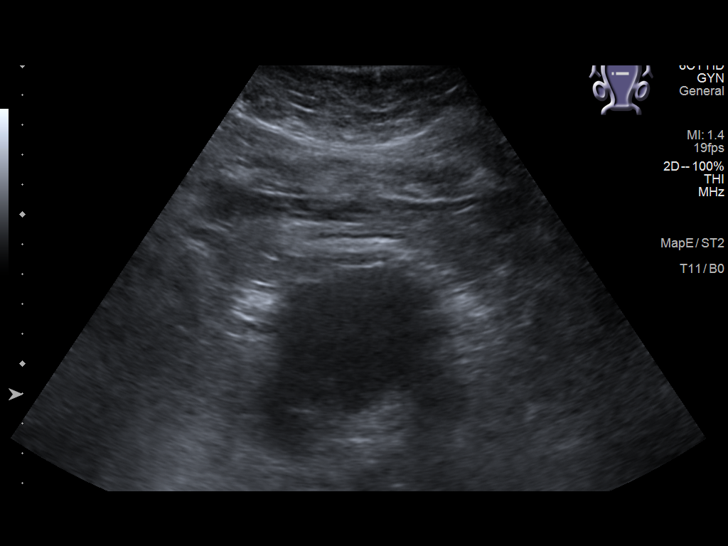
[im 21/125]
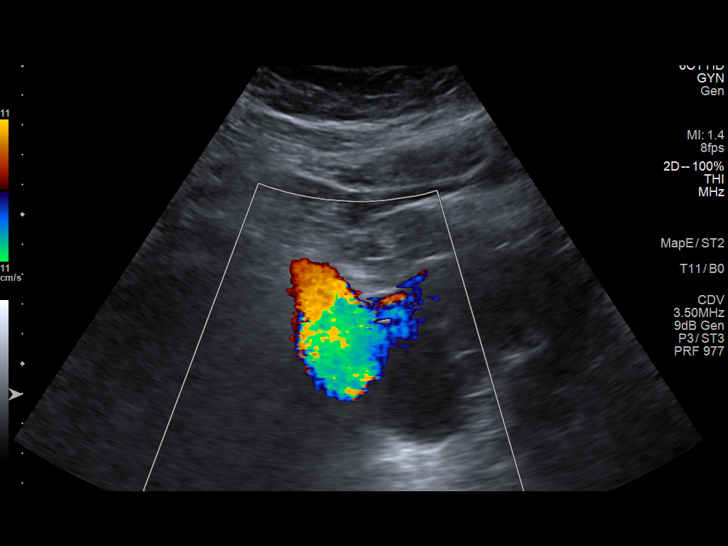
[im 32/125]
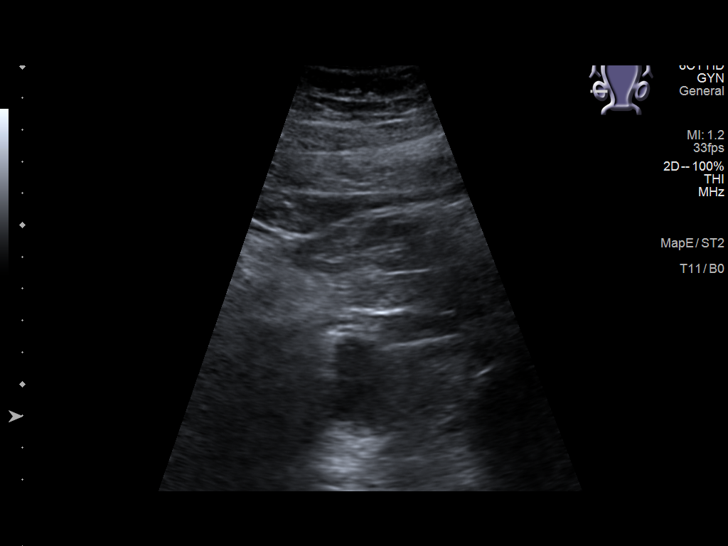
[im 42/125]
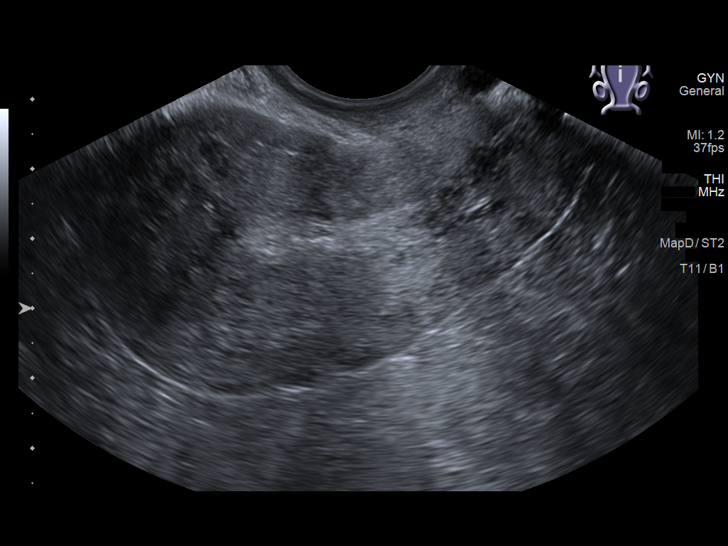
[im 52/125]
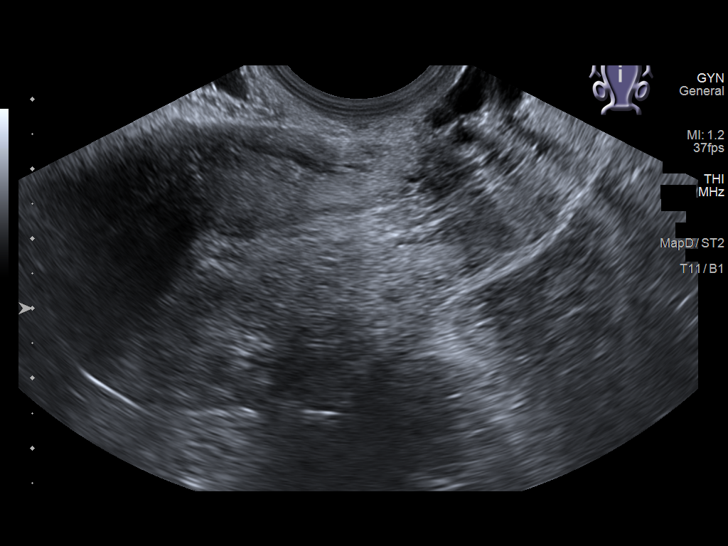
[im 63/125]
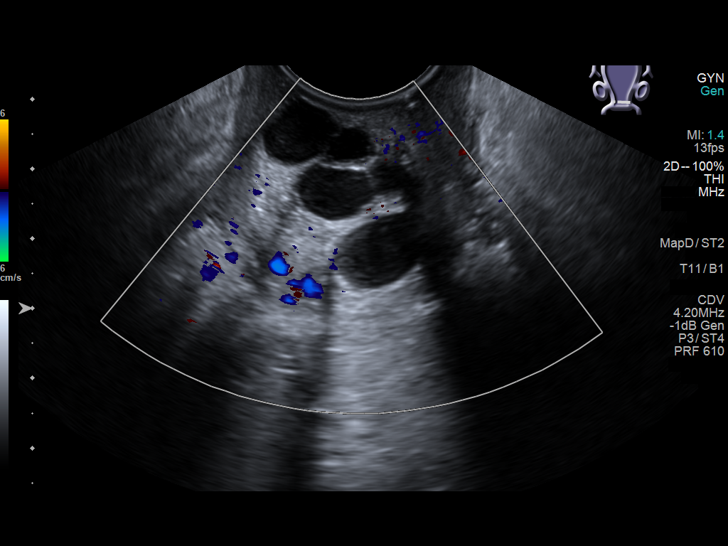
[im 73/125]
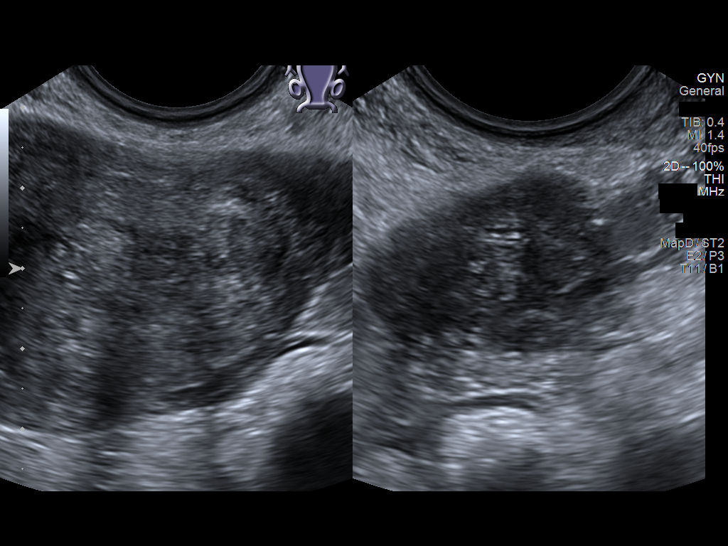
[im 83/125]
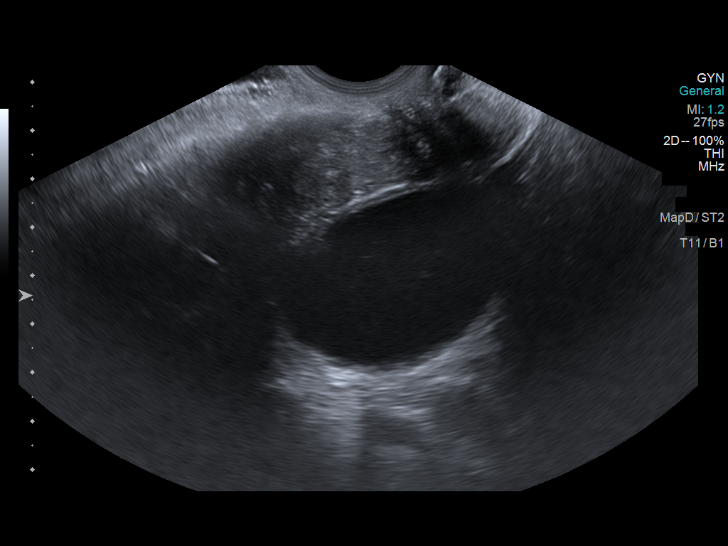
[im 94/125]
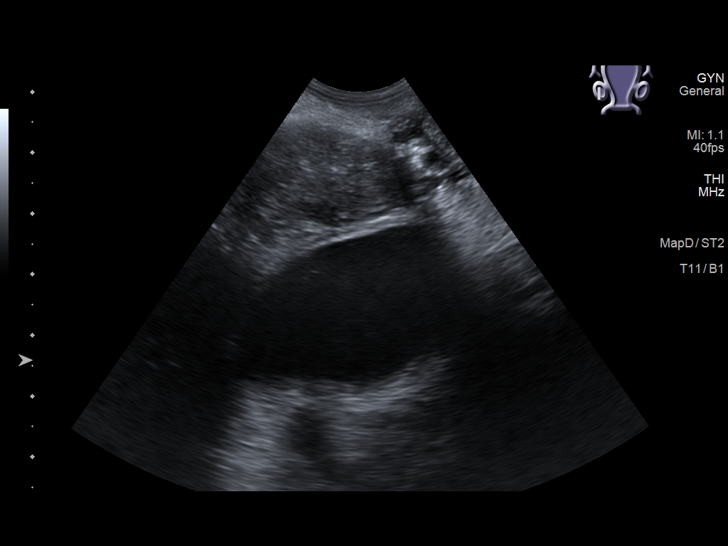
[im 104/125]
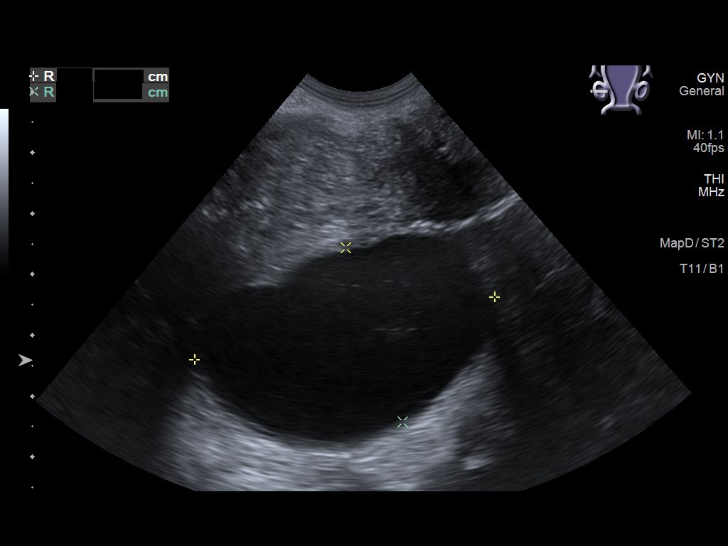
[im 114/125]
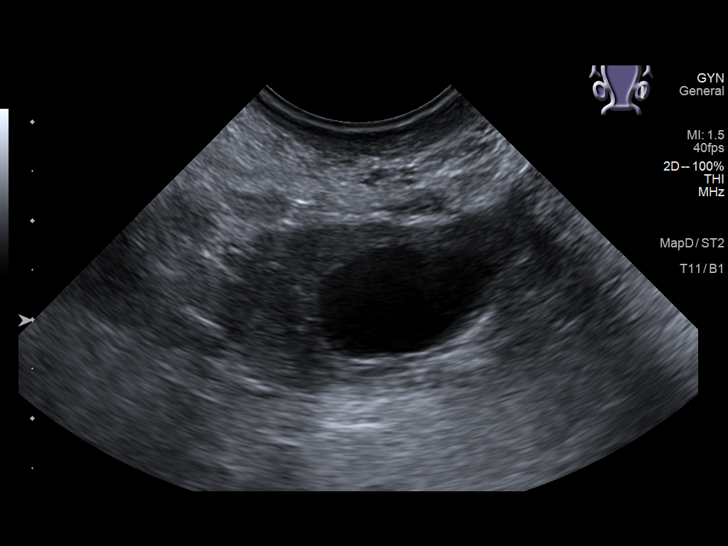
[im 125/125]
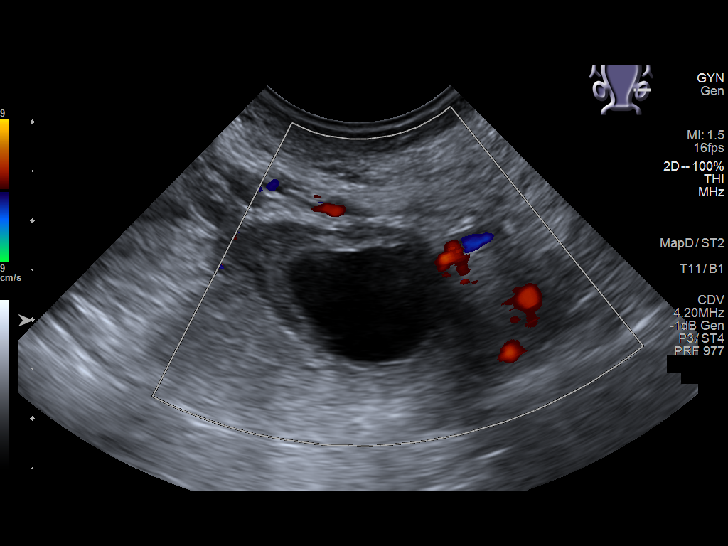

[13 of 25 positions shown; findings below may reference images not displayed]

FINDINGS: Uterus

Measurements: 7.5 x 4.2 x 4.7 centimeters = volume: 76.7 mL.
Multiple uterine fibroids are present. Largest are measured. Fundal
fibroid is 1.7 x 1.1 x 1.8 centimeters. LEFT fibroid is 0.8 x 1.0 x
1.0 centimeters. RIGHT fibroid is 1.4 x 1.3 x 1.3 centimeters.

Endometrium

Thickness: 5.5 millimeters.  No focal abnormality visualized.

Right ovary

Measurements: 3.8 x 0.6 x 3.2 centimeters = volume: 3.5 mL. Cyst is
5.0 x 3.0 x 3.6 centimeters. Mobile internal debris noted at
real-time exam. No nodularity or significant septation. Previously
cyst measured 3.7 x 3.5 x 4.9 centimeters.

Left ovary

Measurements: 3.1 x 1.7 x 2.1 centimeters = volume: 5.8 mL. Normal
appearance/no adnexal mass.

Other findings

No abnormal free fluid.
IMPRESSION: 1. Stable simple appearing cyst in the RIGHT ovary, measuring 5.0 x
3.0 x 3.6 centimeters. Findings are consistent with nonneoplastic
cyst or benign cystic neoplasm. Recommend gynecology consult. If the
lesion is not resected, recommend imaging follow-up with ultrasound
or MRI and intravenous contrast for 2 years from the initial study
2. Uterine fibroids.

## 2023-01-18 ENCOUNTER — Ambulatory Visit: Payer: BC Managed Care – PPO | Admitting: Internal Medicine

## 2023-01-18 ENCOUNTER — Encounter: Payer: Self-pay | Admitting: Internal Medicine

## 2023-01-18 VITALS — BP 116/78 | HR 78 | Temp 96.8°F | Wt 189.0 lb

## 2023-01-18 DIAGNOSIS — E782 Mixed hyperlipidemia: Secondary | ICD-10-CM | POA: Diagnosis not present

## 2023-01-18 DIAGNOSIS — R7303 Prediabetes: Secondary | ICD-10-CM

## 2023-01-18 DIAGNOSIS — Z6831 Body mass index (BMI) 31.0-31.9, adult: Secondary | ICD-10-CM

## 2023-01-18 DIAGNOSIS — Z0001 Encounter for general adult medical examination with abnormal findings: Secondary | ICD-10-CM | POA: Diagnosis not present

## 2023-01-18 DIAGNOSIS — I10 Essential (primary) hypertension: Secondary | ICD-10-CM

## 2023-01-18 DIAGNOSIS — G43C1 Periodic headache syndromes in child or adult, intractable: Secondary | ICD-10-CM

## 2023-01-18 DIAGNOSIS — E039 Hypothyroidism, unspecified: Secondary | ICD-10-CM

## 2023-01-18 DIAGNOSIS — M349 Systemic sclerosis, unspecified: Secondary | ICD-10-CM

## 2023-01-18 DIAGNOSIS — E6609 Other obesity due to excess calories: Secondary | ICD-10-CM

## 2023-01-18 DIAGNOSIS — Z1211 Encounter for screening for malignant neoplasm of colon: Secondary | ICD-10-CM

## 2023-01-18 DIAGNOSIS — K219 Gastro-esophageal reflux disease without esophagitis: Secondary | ICD-10-CM | POA: Diagnosis not present

## 2023-01-18 MED ORDER — SIMVASTATIN 10 MG PO TABS: 10.0000 mg | ORAL_TABLET | Freq: Every day | ORAL | 1 refills | Status: DC

## 2023-01-18 MED ORDER — CETIRIZINE HCL 10 MG PO TABS: 10.0000 mg | ORAL_TABLET | Freq: Every day | ORAL | 1 refills | Status: DC

## 2023-01-18 MED ORDER — AMLODIPINE BESYLATE 10 MG PO TABS: 10.0000 mg | ORAL_TABLET | Freq: Every day | ORAL | 1 refills | Status: DC

## 2023-01-18 MED ORDER — OMEPRAZOLE 20 MG PO CPDR: 20.0000 mg | DELAYED_RELEASE_CAPSULE | Freq: Every day | ORAL | 1 refills | Status: DC

## 2023-01-18 NOTE — Assessment & Plan Note (Signed)
Will check TSH yearly at annual exam Currently not medicated

## 2023-01-18 NOTE — Assessment & Plan Note (Signed)
Try to avoid caffeine withdrawal Continue Excedrin as needed

## 2023-01-18 NOTE — Assessment & Plan Note (Signed)
Encourage diet and exercise for weight loss 

## 2023-01-18 NOTE — Assessment & Plan Note (Signed)
Currently not an issue °We will monitor °

## 2023-01-18 NOTE — Assessment & Plan Note (Signed)
C-Met and lipid profile today Encouraged her to consume low-fat diet Continue simvastatin 

## 2023-01-18 NOTE — Assessment & Plan Note (Signed)
A1c today Encourage low-carb diet and exercise for weight loss 

## 2023-01-18 NOTE — Progress Notes (Signed)
Subjective:    Patient ID: Dana Figueroa, female    DOB: 02-08-1976, 47 y.o.   MRN: [id number]  HPI  Patient presents to clinic today for follow-up of chronic conditions.  HTN: Her BP today is 116/78.  She is taking Amlodipine as prescribed.  There is no ECG on file.  Hypothyroidism: She is not taking Levothyroxine.  She does not follow with endocrinology.  Migraines: These occur 1 x every 6 months.  Triggered by lack of caffeine.  She gets good relief with Excedrin Migraine and ice packs.  She does not follow with neurology.  Scleroderma: She is not currently taking any medications for this.  She does not follow with dermatology.  GERD: Triggered by pepperoni.  She denies breakthrough on Omeprazole.  There is no upper GI on file.  HLD: Her last LDL was 81, triglycerides 81, 07/2022.  She denies myalgias on Simvastatin.  She tries to consume low-fat diet.  Prediabetes: Her last A1c was 6.1%, 07/2022.  She is not taking any oral diabetic medication at this time.  She does not check her sugars.  Review of Systems     Past Medical History:  Diagnosis Date   Headache    migraines   History of kidney stones    History of left breast biopsy    Hypothyroidism    Ovarian cyst    Scleroderma (HCC)     Current Outpatient Medications  Medication Sig Dispense Refill   albuterol (VENTOLIN HFA) 108 (90 Base) MCG/ACT inhaler Inhale 2 puffs into the lungs every 6 (six) hours as needed for wheezing or shortness of breath. 8 g 0   amLODipine (NORVASC) 10 MG tablet Take 1 tablet (10 mg total) by mouth daily. 90 tablet 0   Calcium Carb-Cholecalciferol (CALCIUM 500/VITAMIN D) 500-3.125 MG-MCG TABS Take by mouth.     cetirizine (ZYRTEC) 10 MG tablet Take 1 tablet (10 mg total) by mouth daily. 90 tablet 0   omeprazole (PRILOSEC) 40 MG capsule Take 1 capsule (40 mg total) by mouth daily. 90 capsule 0   simvastatin (ZOCOR) 10 MG tablet Take 1 tablet (10 mg total) by mouth at bedtime. 90 tablet  1   No current facility-administered medications for this visit.    Allergies  Allergen Reactions   Wound Dressing Adhesive Other (See Comments)    Skin tears   Codeine Rash   Steri-Strip Compound Benzoin [Benzoin Compound] Itching, Rash and Other (See Comments)    Skin redness/skin blisters    Family History  Problem Relation Age of Onset   Hypertension Mother    Heart attack Mother    Cancer Maternal Grandfather     Social History   Socioeconomic History   Marital status: Single    Spouse name: Not on file   Number of children: Not on file   Years of education: Not on file   Highest education level: Not on file  Occupational History   Occupation: Chiropractor  Tobacco Use   Smoking status: Never   Smokeless tobacco: Never  Vaping Use   Vaping Use: Never used  Substance and Sexual Activity   Alcohol use: Never   Drug use: Never   Sexual activity: Not Currently    Birth control/protection: None  Other Topics Concern   Not on file  Social History Narrative   Not on file   Social Determinants of Health   Financial Resource Strain: Not on file  Food Insecurity: Not on file  Transportation Needs: Not  on file  Physical Activity: Not on file  Stress: Not on file  Social Connections: Not on file  Intimate Partner Violence: Not on file     Constitutional: Patient reports remote headaches.  Denies fever, malaise, fatigue, or abrupt weight changes.  HEENT: Denies eye pain, eye redness, ear pain, ringing in the ears, wax buildup, runny nose, nasal congestion, bloody nose, or sore throat. Respiratory: Denies difficulty breathing, shortness of breath, cough or sputum production.   Cardiovascular: Denies chest pain, chest tightness, palpitations or swelling in the hands or feet.  Gastrointestinal: Denies abdominal pain, bloating, constipation, diarrhea or blood in the stool.  GU: Denies urgency, frequency, pain with urination, burning sensation, blood in urine,  odor or discharge. Musculoskeletal: Pt reports foot cramps in right foot. Denies decrease in range of motion, difficulty with gait, or joint pain and swelling.  Skin: Denies redness, rashes, lesions or ulcercations.  Neurological: Denies dizziness, difficulty with memory, difficulty with speech or problems with balance and coordination.  Psych: Denies anxiety, depression, SI/HI.  No other specific complaints in a complete review of systems (except as listed in HPI above).  Objective:   Physical Exam   BP 116/78 (BP Location: Left Arm, Patient Position: Sitting, Cuff Size: Large)   Pulse 78   Temp (!) 96.8 F (36 C) (Temporal)   Wt 189 lb (85.7 kg)   SpO2 95%   BMI 31.45 kg/m   Wt Readings from Last 3 Encounters:  10/16/22 207 lb (93.9 kg)  09/20/22 207 lb (93.9 kg)  08/03/22 206 lb (93.4 kg)    General: Appears her stated age, obese, in NAD. Skin: Warm, dry and intact.  HEENT: Head: normal shape and size; Eyes: sclera white, no icterus, conjunctiva pink, PERRLA and EOMs intact;  Neck:  Neck supple, trachea midline. No masses, lumps or thyromegaly present.  Cardiovascular: Normal rate and rhythm. S1,S2 noted.  No murmur, rubs or gallops noted. No JVD or BLE edema.  Pulmonary/Chest: Normal effort and positive vesicular breath sounds. No respiratory distress. No wheezes, rales or ronchi noted.  Abdomen: Soft and nontender. Normal bowel sounds.  Musculoskeletal:  No difficulty with gait.  Neurological: Alert and oriented. Coordination normal.  Psychiatric: Mood and affect normal. Behavior is normal. Judgment and thought content normal.    BMET    Component Value Date/Time   NA 143 08/03/2022 0859   K 4.1 08/03/2022 0859   CL 109 08/03/2022 0859   CO2 28 08/03/2022 0859   GLUCOSE 95 08/03/2022 0859   BUN 15 08/03/2022 0859   CREATININE 0.54 08/03/2022 0859   CALCIUM 9.0 08/03/2022 0859   GFRNONAA >60 10/10/2018 1238   GFRAA >60 10/10/2018 1238    Lipid Panel      Component Value Date/Time   CHOL 132 08/03/2022 0859   TRIG 81 08/03/2022 0859   HDL 35 (L) 08/03/2022 0859   CHOLHDL 3.8 08/03/2022 0859   LDLCALC 81 08/03/2022 0859    CBC    Component Value Date/Time   WBC 5.8 08/03/2022 0859   RBC 4.66 08/03/2022 0859   HGB 13.4 08/03/2022 0859   HCT 40.6 08/03/2022 0859   PLT 190 08/03/2022 0859   MCV 87.1 08/03/2022 0859   MCH 28.8 08/03/2022 0859   MCHC 33.0 08/03/2022 0859   RDW 12.5 08/03/2022 0859    Hgb A1C Lab Results  Component Value Date   HGBA1C 6.1 (H) 08/03/2022           Assessment & Plan:  RTC in 6 months for your annual exam Webb Silversmith, NP

## 2023-01-18 NOTE — Assessment & Plan Note (Signed)
Avoid foods that trigger reflux Encourage weight loss as this can help reduce reflux symptoms Will decrease omeprazole to 20 mg daily

## 2023-01-18 NOTE — Assessment & Plan Note (Signed)
Controlled on amlodipine Reinforced DASH diet and exercise for weight loss C-Met today 

## 2023-01-18 NOTE — Patient Instructions (Signed)

## 2023-01-19 LAB — CBC
HCT: 42.3 % (ref 35.0–45.0)
Hemoglobin: 13.9 g/dL (ref 11.7–15.5)
MCH: 28.4 pg (ref 27.0–33.0)
MCHC: 32.9 g/dL (ref 32.0–36.0)
MCV: 86.3 fL (ref 80.0–100.0)
MPV: 10.7 fL (ref 7.5–12.5)
Platelets: 267 10*3/uL (ref 140–400)
RBC: 4.9 10*6/uL (ref 3.80–5.10)
RDW: 12.3 % (ref 11.0–15.0)
WBC: 7.1 10*3/uL (ref 3.8–10.8)

## 2023-01-19 LAB — COMPLETE METABOLIC PANEL WITH GFR
AG Ratio: 1.5 (calc) (ref 1.0–2.5)
ALT: 22 U/L (ref 6–29)
AST: 21 U/L (ref 10–35)
Albumin: 4.4 g/dL (ref 3.6–5.1)
Alkaline phosphatase (APISO): 92 U/L (ref 31–125)
BUN: 12 mg/dL (ref 7–25)
CO2: 28 mmol/L (ref 20–32)
Calcium: 9.6 mg/dL (ref 8.6–10.2)
Chloride: 105 mmol/L (ref 98–110)
Creat: 0.78 mg/dL (ref 0.50–0.99)
Globulin: 3 g/dL (calc) (ref 1.9–3.7)
Glucose, Bld: 98 mg/dL (ref 65–99)
Potassium: 3.9 mmol/L (ref 3.5–5.3)
Sodium: 143 mmol/L (ref 135–146)
Total Bilirubin: 0.5 mg/dL (ref 0.2–1.2)
Total Protein: 7.4 g/dL (ref 6.1–8.1)
eGFR: 94 mL/min/{1.73_m2} (ref >=60)

## 2023-01-19 LAB — HEMOGLOBIN A1C
Hgb A1c MFr Bld: 5.8 % of total Hgb — ABNORMAL HIGH (ref <5.7)
Mean Plasma Glucose: 120 mg/dL
eAG (mmol/L): 6.6 mmol/L

## 2023-01-19 LAB — LIPID PANEL
Cholesterol: 164 mg/dL (ref <200)
HDL: 50 mg/dL (ref >=50)
LDL Cholesterol (Calc): 94 mg/dL (calc)
Non-HDL Cholesterol (Calc): 114 mg/dL (calc) (ref <130)
Total CHOL/HDL Ratio: 3.3 (calc) (ref <5.0)
Triglycerides: 102 mg/dL (ref <150)

## 2023-02-04 LAB — COLOGUARD: COLOGUARD: NEGATIVE

## 2023-03-18 IMAGING — US US PELVIS COMPLETE WITH TRANSVAGINAL
1 series · 13 of 25 positions shown · non-contrast
Comparison: 06/01/2021

CLINICAL DATA: RIGHT ovarian cyst, follow-up, LMP 08/28/2021



[Series 1: us pelvis complete with transvaginal · 0.21mm/px · 13 of 93 slices shown]
[im 1/93]
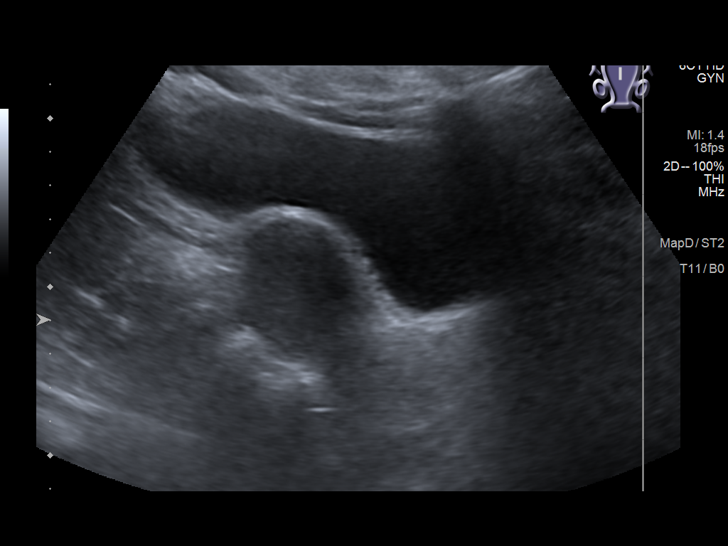
[im 8/93]
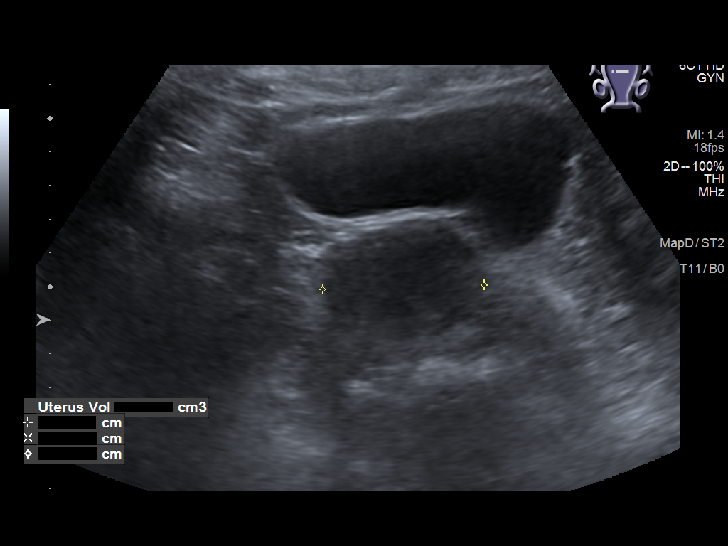
[im 16/93]
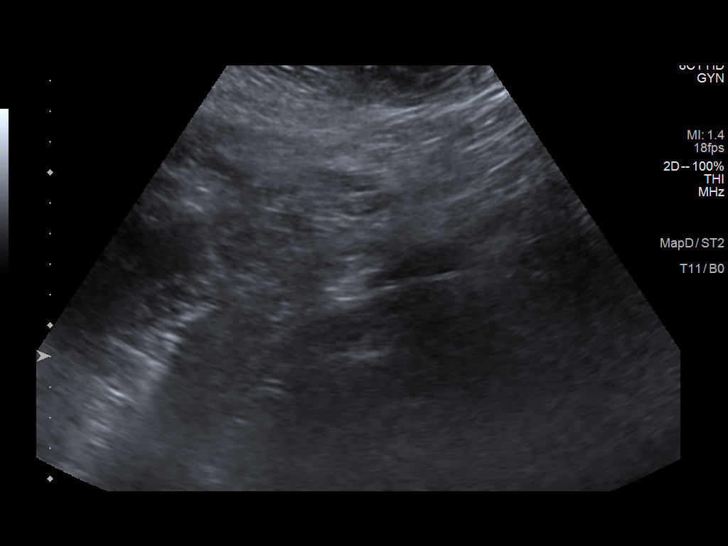
[im 24/93]
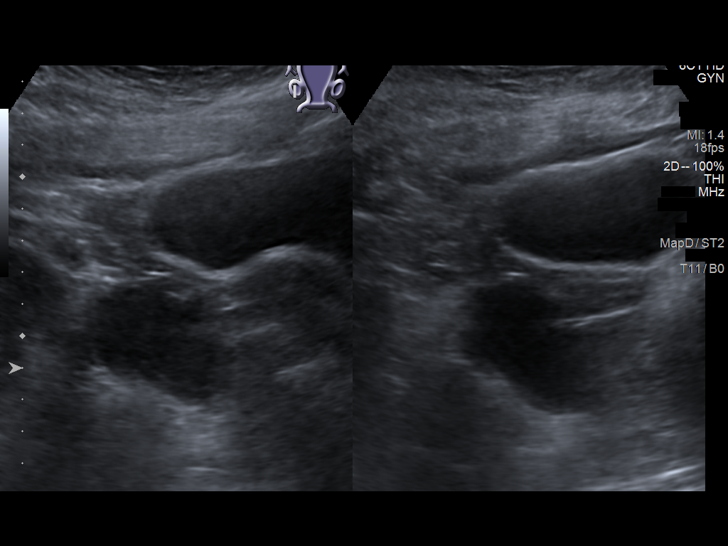
[im 31/93]
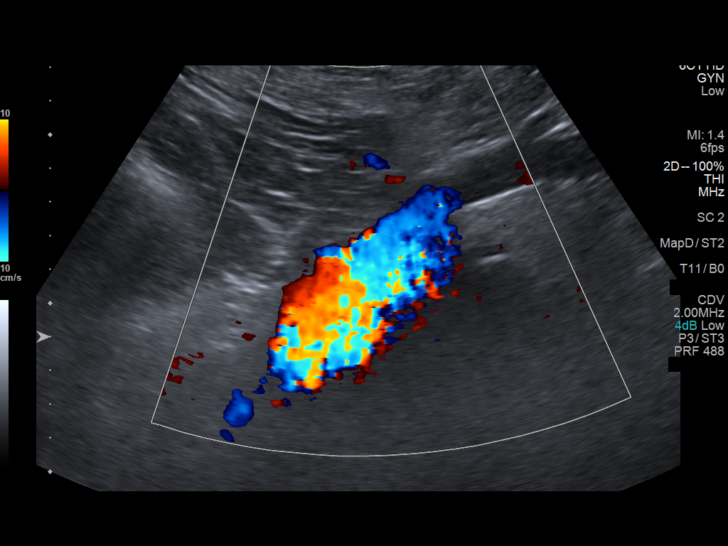
[im 39/93]
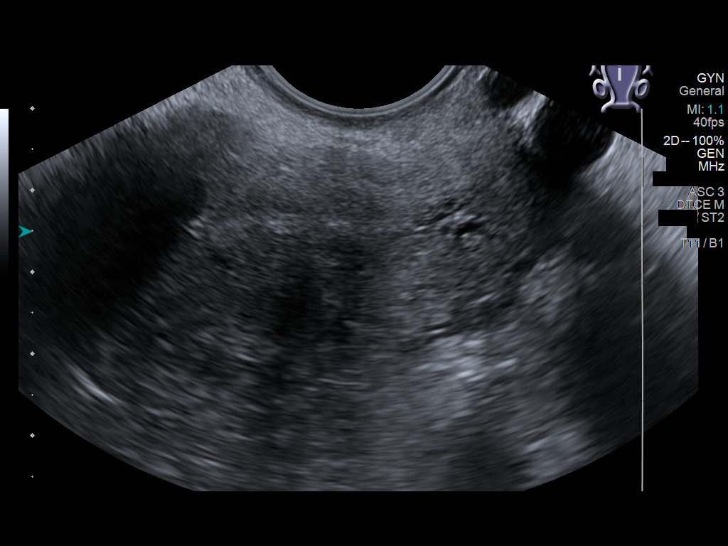
[im 47/93]
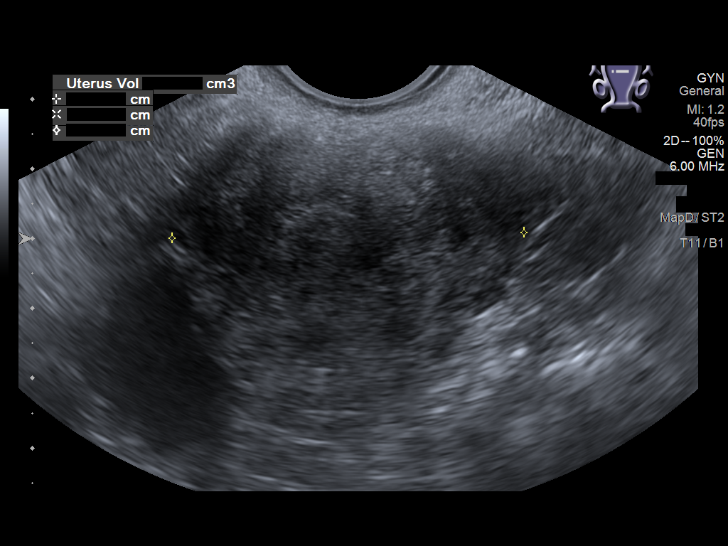
[im 54/93]
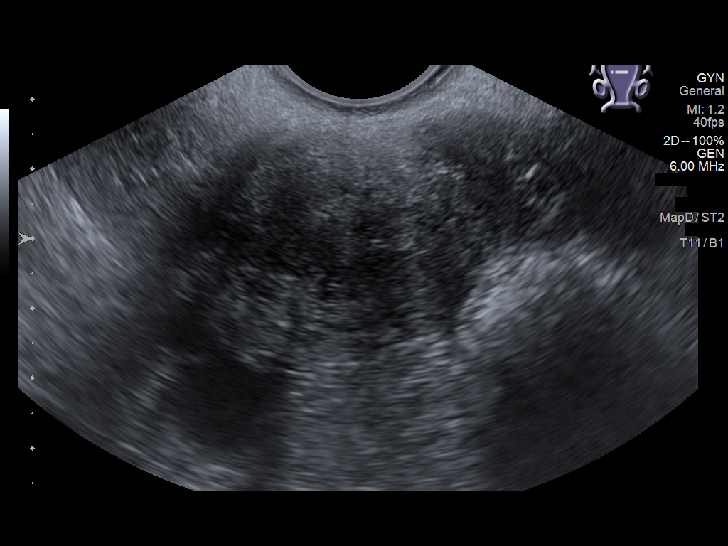
[im 62/93]
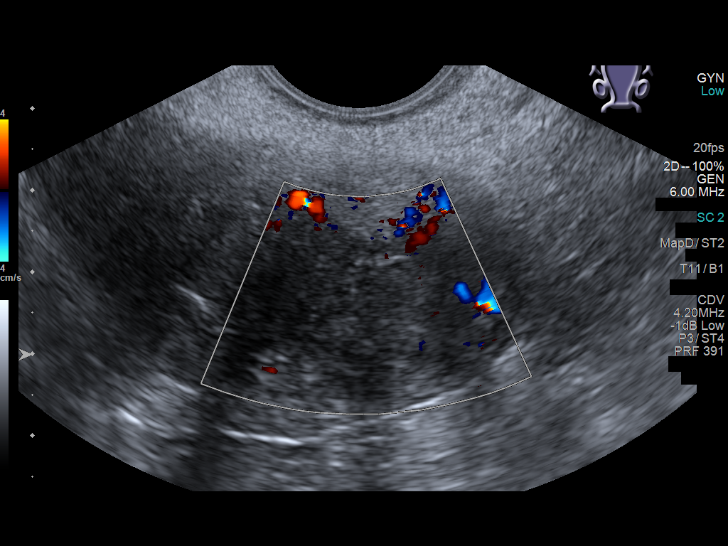
[im 70/93]
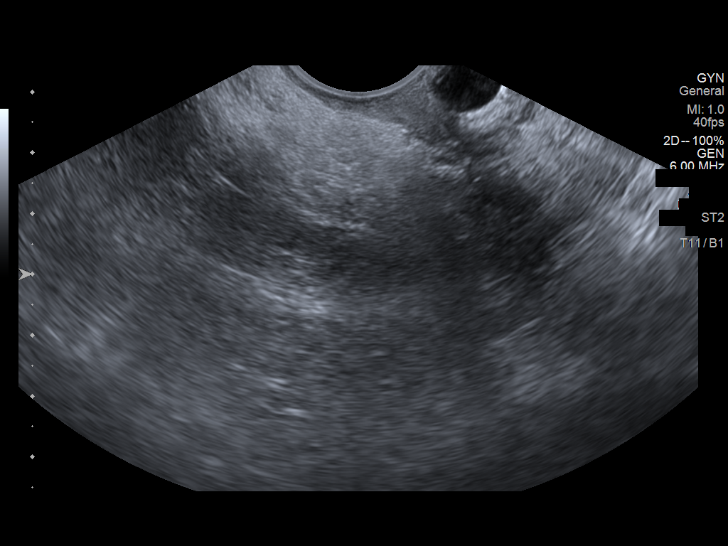
[im 77/93]
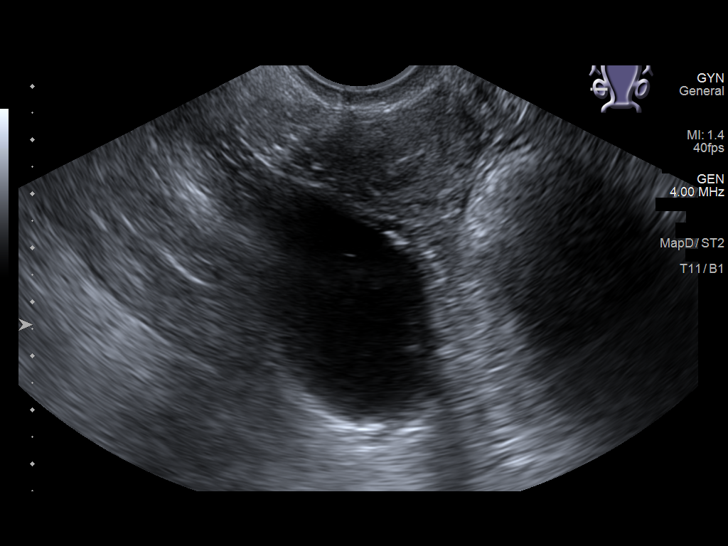
[im 85/93]
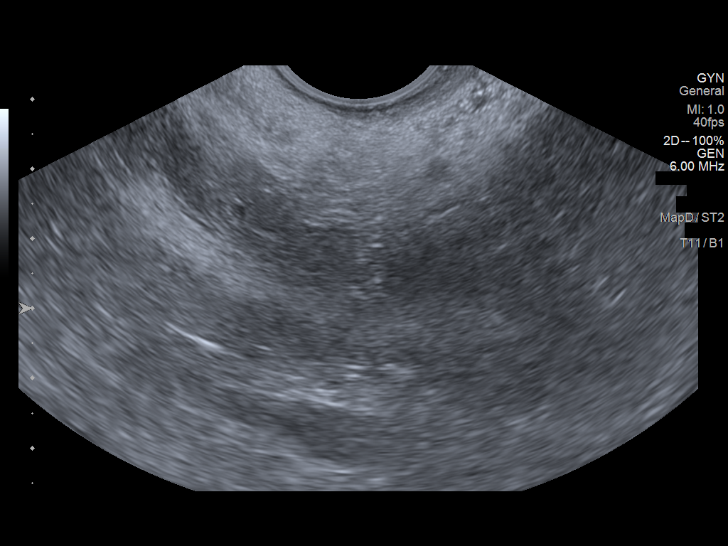
[im 93/93]
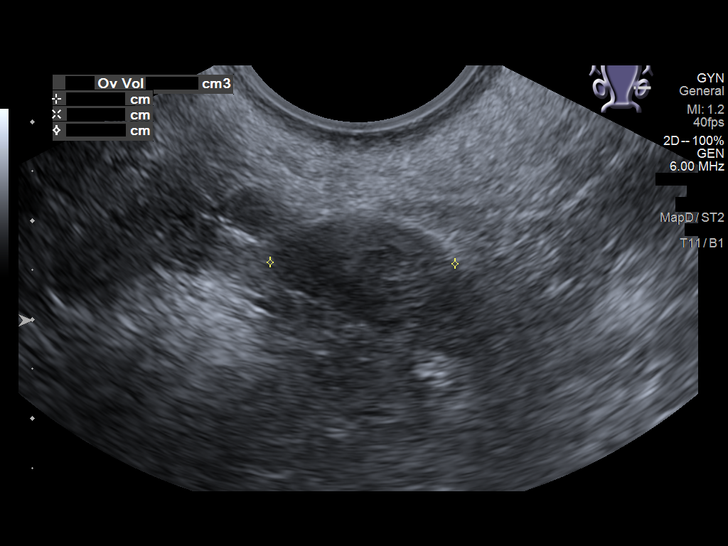

[13 of 25 positions shown; findings below may reference images not displayed]

FINDINGS: Uterus

Measurements: 7.5 x 3.8 x 5.0 cm = volume: 75 mL. Anteverted.
Heterogeneous myometrium. Small potentially submucosal leiomyoma mid
uterus 8 x 11 x 8 mm. Intramural leiomyoma posterior upper uterus 13
x 10 x 13 mm. Intramural leiomyoma upper RIGHT uterus 12 x 12 x 14
mm.

Endometrium

Thickness: 4 mm.  No endometrial fluid or mass

Right ovary

Measurements: 5.5 x 4.2 x 3.1 cm = volume: 38 mL. Cyst identified
within RIGHT ovary, 4.5 x 3.5 x 2.8 cm, previously 5.0 x 3.0 x
cm. Minimal internal debris.

Left ovary

Measurements: 2.2 x 1.2 x 1.9 cm = volume: 3.0 mL. Normal morphology
without mass

Other findings

No free pelvic fluid.  No adnexal masses.
IMPRESSION: Small uterine leiomyomata, 1 of which likely a submucosal at the mid
uterus, Vibes mm diameter.

RIGHT ovarian cyst containing minimal debris, slightly decreased in
size from previous exam; this likely represents a benign functional
cyst and no follow-up imaging is recommended.

## 2023-05-03 ENCOUNTER — Ambulatory Visit: Payer: BC Managed Care – PPO | Admitting: Internal Medicine

## 2023-05-03 ENCOUNTER — Encounter: Payer: Self-pay | Admitting: Internal Medicine

## 2023-05-03 VITALS — BP 112/79 | HR 73 | Temp 96.8°F | Wt 199.0 lb

## 2023-05-03 DIAGNOSIS — H8111 Benign paroxysmal vertigo, right ear: Secondary | ICD-10-CM | POA: Diagnosis not present

## 2023-05-03 MED ORDER — MECLIZINE HCL 25 MG PO TABS: 25.0000 mg | ORAL_TABLET | Freq: Three times a day (TID) | ORAL | 0 refills | Status: DC | PRN

## 2023-05-03 NOTE — Progress Notes (Signed)
Subjective:    Patient ID: Dana Figueroa, female    DOB: 03-01-1976, 47 y.o.   MRN: [id number]  HPI  Patient presents to clinic today with complaint of dizziness.  This started 1 week ago after flying home from [place].  She describes the dizziness as "the room spinning". This is worse if she lays down, or when she turns her head, especially on the right side. She reports associated nausea.  She denies vision changes, sweating, chest pain or shortness of breath. She has had this briefly about 10 years ago, but did not last this long. She has not tried anything OTC for this.  Review of Systems     Past Medical History:  Diagnosis Date   Headache    migraines   History of kidney stones    History of left breast biopsy    Hypothyroidism    Ovarian cyst    Scleroderma (HCC)     Current Outpatient Medications  Medication Sig Dispense Refill   albuterol (VENTOLIN HFA) 108 (90 Base) MCG/ACT inhaler Inhale 2 puffs into the lungs every 6 (six) hours as needed for wheezing or shortness of breath. 8 g 0   amLODipine (NORVASC) 10 MG tablet Take 1 tablet (10 mg total) by mouth daily. 90 tablet 1   Calcium Carb-Cholecalciferol (CALCIUM 500/VITAMIN D) 500-3.125 MG-MCG TABS Take by mouth.     cetirizine (ZYRTEC) 10 MG tablet Take 1 tablet (10 mg total) by mouth daily. 90 tablet 1   omeprazole (PRILOSEC) 20 MG capsule Take 1 capsule (20 mg total) by mouth daily. 90 capsule 1   simvastatin (ZOCOR) 10 MG tablet Take 1 tablet (10 mg total) by mouth at bedtime. 90 tablet 1   No current facility-administered medications for this visit.    Allergies  Allergen Reactions   Wound Dressing Adhesive Other (See Comments)    Skin tears   Codeine Rash   Steri-Strip Compound Benzoin [Benzoin Compound] Itching, Rash and Other (See Comments)    Skin redness/skin blisters    Family History  Problem Relation Age of Onset   Hypertension Mother    Heart attack Mother    Cancer Maternal Grandfather      Social History   Socioeconomic History   Marital status: Single    Spouse name: Not on file   Number of children: Not on file   Years of education: Not on file   Highest education level: 12th grade  Occupational History   Occupation: Advertising account executive  Tobacco Use   Smoking status: Never   Smokeless tobacco: Never  Vaping Use   Vaping Use: Never used  Substance and Sexual Activity   Alcohol use: Never   Drug use: Never   Sexual activity: Not Currently    Birth control/protection: None  Other Topics Concern   Not on file  Social History Narrative   Not on file   Social Determinants of Health   Financial Resource Strain: Not on file  Food Insecurity: No Food Insecurity (05/01/2023)   Hunger Vital Sign    Worried About Running Out of Food in the Last Year: Never true    Ran Out of Food in the Last Year: Never true  Transportation Needs: No Transportation Needs (05/01/2023)   PRAPARE - Administrator, Civil Service (Medical): No    Lack of Transportation (Non-Medical): No  Physical Activity: Insufficiently Active (05/01/2023)   Exercise Vital Sign    Days of Exercise per Week: 2 days  Minutes of Exercise per Session: 30 min  Stress: No Stress Concern Present (05/01/2023)   [employer] of Occupational Health - Occupational Stress Questionnaire    Feeling of Stress : Not at all  Social Connections: Moderately Isolated (05/01/2023)   Social Connection and Isolation Panel [NHANES]    Frequency of Communication with Friends and Family: Three times a week    Frequency of Social Gatherings with Friends and Family: Once a week    Attends Religious Services: 1 to 4 times per year    Active Member of [employer] or Organizations: No    Attends Engineer, structural: Not on file    Marital Status: Divorced  Intimate Partner Violence: Not on file     Constitutional: Patient reports intermittent headaches.  Denies fever, malaise, fatigue, or abrupt weight  changes.  HEENT: Denies eye pain, eye redness, ear pain, ringing in the ears, wax buildup, runny nose, nasal congestion, bloody nose, or sore throat. Respiratory: Denies difficulty breathing, shortness of breath, cough or sputum production.   Cardiovascular: Denies chest pain, chest tightness, palpitations or swelling in the hands or feet.  Gastrointestinal: Pt reports nausea. Denies abdominal pain, bloating, constipation, diarrhea or blood in the stool.  Musculoskeletal: Denies decrease in range of motion, difficulty with gait, muscle pain or joint pain and swelling.  Skin: Denies redness, rashes, lesions or ulcercations.  Neurological: Patient reports dizziness.  Denies difficulty with memory, difficulty with speech or problems with balance and coordination.    No other specific complaints in a complete review of systems (except as listed in HPI above).  Objective:   Physical Exam   BP 112/79 (BP Location: Left Arm, Patient Position: Sitting, Cuff Size: Normal)   Pulse 73   Temp (!) 96.8 F (36 C) (Temporal)   Wt 199 lb (90.3 kg)   SpO2 98%   BMI 33.12 kg/m   Wt Readings from Last 3 Encounters:  01/18/23 189 lb (85.7 kg)  10/16/22 207 lb (93.9 kg)  09/20/22 207 lb (93.9 kg)    General: Appears  her stated age, obese, in NAD. HEENT: Head: normal shape and size; Eyes: sclera white, no icterus, conjunctiva pink, PERRLA and EOMs intact; Ears: Tm's gray and intact, normal light reflex; Nose: mucosa pink and moist, septum midline; Throat/Mouth: Teeth present, mucosa pink and moist, no exudate, lesions or ulcerations noted.  Neck:  No adenopathy noted. Cardiovascular: Normal rate and rhythm. S1,S2 noted.  No murmur, rubs or gallops noted.  Pulmonary/Chest: Normal effort and positive vesicular breath sounds. No respiratory distress. No wheezes, rales or ronchi noted.  Musculoskeletal: No difficulty with gait.  Neurological: Alert and oriented. Cranial nerves II-XII grossly intact.  Coordination normal.     BMET    Component Value Date/Time   NA 143 01/18/2023 0832   K 3.9 01/18/2023 0832   CL 105 01/18/2023 0832   CO2 28 01/18/2023 0832   GLUCOSE 98 01/18/2023 0832   BUN 12 01/18/2023 0832   CREATININE 0.78 01/18/2023 0832   CALCIUM 9.6 01/18/2023 0832   GFRNONAA >60 10/10/2018 1238   GFRAA >60 10/10/2018 1238    Lipid Panel     Component Value Date/Time   CHOL 164 01/18/2023 0832   TRIG 102 01/18/2023 0832   HDL 50 01/18/2023 0832   CHOLHDL 3.3 01/18/2023 0832   LDLCALC 94 01/18/2023 0832    CBC    Component Value Date/Time   WBC 7.1 01/18/2023 0832   RBC 4.90 01/18/2023 0832   HGB  13.9 01/18/2023 0832   HCT 42.3 01/18/2023 0832   PLT 267 01/18/2023 0832   MCV 86.3 01/18/2023 0832   MCH 28.4 01/18/2023 0832   MCHC 32.9 01/18/2023 0832   RDW 12.3 01/18/2023 0832    Hgb A1C Lab Results  Component Value Date   HGBA1C 5.8 (H) 01/18/2023           Assessment & Plan:   BPPV, Right:  Encouraged adequate water intake RX for meclizine 25 mg TID prn Advised her to perform the Epley maneuver at home  RTC in 3 months for annual exam Nicki Reaper, NP

## 2023-05-03 NOTE — Patient Instructions (Signed)
How to Perform the Epley Maneuver The Epley maneuver is an exercise that relieves symptoms of vertigo. Vertigo is the feeling that you or your surroundings are moving when they are not. When you feel vertigo, you may feel like the room is spinning and may have trouble walking. The Epley maneuver is used for a type of vertigo caused by a calcium deposit in a part of the inner ear. The maneuver involves changing head positions to help the deposit move out of the area. You can do this maneuver at home whenever you have symptoms of vertigo. You can repeat it in 24 hours if your vertigo has not gone away. Even though the Epley maneuver may relieve your vertigo for a few weeks, it is possible that your symptoms will return. This maneuver relieves vertigo, but it does not relieve dizziness. What are the risks? If it is done correctly, the Epley maneuver is considered safe. Sometimes it can lead to dizziness or nausea that goes away after a short time. If you develop other symptoms--such as changes in vision, weakness, or numbness--stop doing the maneuver and call your health care provider. Supplies needed: A bed or table. A pillow. How to do the Epley maneuver     Sit on the edge of a bed or table with your back straight and your legs extended or hanging over the edge of the bed or table. Turn your head halfway toward the affected ear or side as told by your health care provider. Lie backward quickly with your head turned until you are lying flat on your back. Your head should dangle (head-hanging position). You may want to position a pillow under your shoulders. Hold this position for at least 30 seconds. If you feel dizzy or have symptoms of vertigo, continue to hold the position until the symptoms stop. Turn your head to the opposite direction until your unaffected ear is facing down. Your head should continue to dangle. Hold this position for at least 30 seconds. If you feel dizzy or have symptoms of  vertigo, continue to hold the position until the symptoms stop. Turn your whole body to the same side as your head so that you are positioned on your side. Your head will now be nearly facedown and no longer needs to dangle. Hold for at least 30 seconds. If you feel dizzy or have symptoms of vertigo, continue to hold the position until the symptoms stop. Sit back up. You can repeat the maneuver in 24 hours if your vertigo does not go away. Follow these instructions at home: For 24 hours after doing the Epley maneuver: Keep your head in an upright position. When lying down to sleep or rest, keep your head raised (elevated) with two or more pillows. Avoid excessive neck movements. Activity Do not drive or use machinery if you feel dizzy. After doing the Epley maneuver, return to your normal activities as told by your health care provider. Ask your health care provider what activities are safe for you. General instructions Drink enough fluid to keep your urine pale yellow. Do not drink alcohol. Take over-the-counter and prescription medicines only as told by your health care provider. Keep all follow-up visits. This is important. Preventing vertigo symptoms Ask your health care provider if there is anything you should do at home to prevent vertigo. He or she may recommend that you: Keep your head elevated with two or more pillows while you sleep. Do not sleep on the side of your affected ear. Get   up slowly from bed. Avoid sudden movements during the day. Avoid extreme head positions or movement, such as looking up or bending over. Contact a health care provider if: Your vertigo gets worse. You have other symptoms, including: Nausea. Vomiting. Headache. Get help right away if you: Have vision changes. Have a headache or neck pain that is severe or getting worse. Cannot stop vomiting. Have new numbness or weakness in any part of your body. These symptoms may represent a serious problem  that is an emergency. Do not wait to see if the symptoms will go away. Get medical help right away. Call your local emergency services (911 in the [place] Do not drive yourself to the hospital. Summary Vertigo is the feeling that you or your surroundings are moving when they are not. The Epley maneuver is an exercise that relieves symptoms of vertigo. If the Epley maneuver is done correctly, it is considered safe. This information is not intended to replace advice given to you by your health care provider. Make sure you discuss any questions you have with your health care provider. Document Revised: 09/28/2020 Document Reviewed: 09/28/2020 Elsevier Patient Education  2024 [employer]  

## 2023-07-11 ENCOUNTER — Other Ambulatory Visit: Payer: Self-pay | Admitting: Oncology

## 2023-07-11 DIAGNOSIS — Z006 Encounter for examination for normal comparison and control in clinical research program: Secondary | ICD-10-CM

## 2023-07-16 ENCOUNTER — Other Ambulatory Visit
Admission: RE | Admit: 2023-07-16 | Discharge: 2023-07-16 | Disposition: A | Discharge status: Home / Self Care | Payer: BC Managed Care – PPO | Attending: Oncology | Admitting: Oncology

## 2023-07-16 DIAGNOSIS — Z006 Encounter for examination for normal comparison and control in clinical research program: Secondary | ICD-10-CM | POA: Insufficient documentation

## 2023-07-20 ENCOUNTER — Other Ambulatory Visit: Payer: Self-pay | Admitting: Internal Medicine

## 2023-07-20 DIAGNOSIS — Z0001 Encounter for general adult medical examination with abnormal findings: Secondary | ICD-10-CM

## 2023-07-22 NOTE — Telephone Encounter (Signed)
Requested Prescriptions  Pending Prescriptions Disp Refills   amLODipine (NORVASC) 10 MG tablet [Pharmacy Med Name: AMLODIPINE 10 MG TAB[*]] 90 tablet 0    Sig: TAKE ONE TABLET BY MOUTH ONE TIME DAILY     Cardiovascular: Calcium Channel Blockers 2 Passed - 07/20/2023 11:52 AM      Passed - Last BP in normal range    BP Readings from Last 1 Encounters:  05/03/23 112/79         Passed - Last Heart Rate in normal range    Pulse Readings from Last 1 Encounters:  05/03/23 73         Passed - Valid encounter within last 6 months    Recent Outpatient Visits           2 months ago BPPV (benign paroxysmal positional vertigo), right   [place] [place], Salvadore Oxford, NP   6 months ago Mixed hyperlipidemia   [place] [facility] [place], Salvadore Oxford, NP   9 months ago Chronic cough   [place] [place], Salvadore Oxford, NP   10 months ago Nocturnal cough   [facility] [address], Salvadore Oxford, NP   11 months ago Encounter for general adult medical examination with abnormal findings   [facility] [facility] [place], Salvadore Oxford, NP       Future Appointments             In 2 weeks Sampson Si, Salvadore Oxford, NP [place] [facility]             cetirizine (ZYRTEC) 10 MG tablet [Pharmacy Med Name: CETIRIZINE 10 MG TAB[*]] 90 tablet 0    Sig: TAKE ONE TABLET BY MOUTH ONE TIME DAILY     Ear, Nose, and Throat:  Antihistamines 2 Passed - 07/20/2023 11:52 AM      Passed - Cr in normal range and within 360 days    Creat  Date Value Ref Range Status  01/18/2023 0.78 0.50 - 0.99 mg/dL Final         Passed - Valid encounter within last 12 months    Recent Outpatient Visits           2 months ago BPPV (benign paroxysmal positional vertigo), right   [facility] [place], Salvadore Oxford, NP   6 months ago Mixed hyperlipidemia   [facility]  [place], Salvadore Oxford, NP   9 months ago Chronic cough   [place] [facility] [place], Salvadore Oxford, NP   10 months ago Nocturnal cough   [place] [facility] [place], Salvadore Oxford, NP   11 months ago Encounter for general adult medical examination with abnormal findings   [place] [place], Salvadore Oxford, NP       Future Appointments             In 2 weeks Sampson Si, Salvadore Oxford, NP [facility]             omeprazole (PRILOSEC) 20 MG capsule [Pharmacy Med Name: OMEPRAZOLE 20 MG CAP[*]] 90 capsule 0    Sig: TAKE ONE CAPSULE BY MOUTH ONE TIME DAILY     Gastroenterology: Proton Pump Inhibitors Passed - 07/20/2023 11:52 AM      Passed - Valid encounter within last 12 months    Recent  Outpatient Visits           2 months ago BPPV (benign paroxysmal positional vertigo), right   [place] [facility] [place], Salvadore Oxford, NP   6 months ago Mixed hyperlipidemia   [place] [place], Salvadore Oxford, NP   9 months ago Chronic cough   [facility] [place], Salvadore Oxford, NP   10 months ago Nocturnal cough   [place] [facility] [place], Salvadore Oxford, NP   11 months ago Encounter for general adult medical examination with abnormal findings   [place] [facility] [place], Salvadore Oxford, NP       Future Appointments             In 2 weeks Lorre Munroe, NP [facility]             simvastatin (ZOCOR) 10 MG tablet [Pharmacy Med Name: SIMVASTATIN 10 MG TAB[*]] 90 tablet 0    Sig: TAKE ONE TABLET BY MOUTH AT BEDTIME     Cardiovascular:  Antilipid - Statins Failed - 07/20/2023 11:52 AM      Failed - Lipid Panel in normal range within the last 12 months    Cholesterol  Date Value Ref Range Status  01/18/2023 164 <200 mg/dL Final   LDL Cholesterol (Calc)  Date Value Ref Range Status  01/18/2023 94  mg/dL (calc) Final    Comment:    Reference range: <100 . Desirable range <100 mg/dL for primary prevention;   <70 mg/dL for patients with CHD or diabetic patients  with > or = 2 CHD risk factors. Marland Kitchen LDL-C is now calculated using the Martin-Hopkins  calculation, which is a validated novel method providing  better accuracy than the Friedewald equation in the  estimation of LDL-C.  Horald Pollen et al. Lenox Ahr. 1610;960(45): 2061-2068  (http://education.Quest[facility].com/faq/FAQ164)    HDL  Date Value Ref Range Status  01/18/2023 50 > OR = 50 mg/dL Final   Triglycerides  Date Value Ref Range Status  01/18/2023 102 <150 mg/dL Final         Passed - Patient is not pregnant      Passed - Valid encounter within last 12 months    Recent Outpatient Visits           2 months ago BPPV (benign paroxysmal positional vertigo), right   [facility] [place], Salvadore Oxford, NP   6 months ago Mixed hyperlipidemia   [place] [facility] [place], Salvadore Oxford, NP   9 months ago Chronic cough   [place] [facility] [place], Salvadore Oxford, NP   10 months ago Nocturnal cough   [place] [facility] [place], Salvadore Oxford, NP   11 months ago Encounter for general adult medical examination with abnormal findings   [facility] [place], Salvadore Oxford, NP       Future Appointments             In 2 weeks Sampson Si, Salvadore Oxford, NP  [facility]

## 2023-07-27 LAB — GENECONNECT MOLECULAR SCREEN: Genetic Analysis Overall Interpretation: NEGATIVE

## 2023-08-09 ENCOUNTER — Ambulatory Visit (INDEPENDENT_AMBULATORY_CARE_PROVIDER_SITE_OTHER): Payer: BC Managed Care – PPO | Admitting: Internal Medicine

## 2023-08-09 ENCOUNTER — Encounter: Payer: Self-pay | Admitting: Internal Medicine

## 2023-08-09 VITALS — BP 116/72 | HR 81 | Temp 96.0°F | Ht 65.0 in | Wt 202.0 lb

## 2023-08-09 DIAGNOSIS — Z23 Encounter for immunization: Secondary | ICD-10-CM | POA: Diagnosis not present

## 2023-08-09 DIAGNOSIS — Z1231 Encounter for screening mammogram for malignant neoplasm of breast: Secondary | ICD-10-CM | POA: Diagnosis not present

## 2023-08-09 DIAGNOSIS — R7303 Prediabetes: Secondary | ICD-10-CM

## 2023-08-09 DIAGNOSIS — Z0001 Encounter for general adult medical examination with abnormal findings: Secondary | ICD-10-CM | POA: Diagnosis not present

## 2023-08-09 DIAGNOSIS — Z6833 Body mass index (BMI) 33.0-33.9, adult: Secondary | ICD-10-CM

## 2023-08-09 DIAGNOSIS — E782 Mixed hyperlipidemia: Secondary | ICD-10-CM

## 2023-08-09 DIAGNOSIS — E039 Hypothyroidism, unspecified: Secondary | ICD-10-CM

## 2023-08-09 DIAGNOSIS — E6609 Other obesity due to excess calories: Secondary | ICD-10-CM

## 2023-08-09 DIAGNOSIS — I776 Arteritis, unspecified: Secondary | ICD-10-CM

## 2023-08-09 MED ORDER — PREDNISONE 10 MG PO TABS: ORAL_TABLET | ORAL | 0 refills | Status: DC

## 2023-08-09 NOTE — Patient Instructions (Signed)

## 2023-08-09 NOTE — Progress Notes (Signed)
Subjective:    Patient ID: Dana Figueroa, female    DOB: 07/04/1976, 47 y.o.   MRN: [id number]  HPI  Patient presents to clinic today for her annual exam.  She reports a rash to her bilateral feet.  She noticed this about 6 months ago.  She reports the rash is worse on the right foot than the left foot.  She reports she had a previous right foot fracture and subsequent surgery and is not sure if this is a contributing factor to the rash.  She has not tried anything OTC for this.  Flu: 07/2022 Tetanus: 09/2018 COVID: X 2 Pap smear: 03/2021 Mammogram: 08/2022 Colon screening: 01/2023, Cologuard Vision screening: annually Dentist: biannually  Diet: She does eat meat. She does consumes fruits and veggies. She tries to avoid fried foods. She drinks mostly water, soda, coffee Exercise: VR exercise, 3 x week   Review of Systems  Past Medical History:  Diagnosis Date   Headache    migraines   History of kidney stones    History of left breast biopsy    Hypothyroidism    Ovarian cyst    Scleroderma (HCC)     Current Outpatient Medications  Medication Sig Dispense Refill   albuterol (VENTOLIN HFA) 108 (90 Base) MCG/ACT inhaler Inhale 2 puffs into the lungs every 6 (six) hours as needed for wheezing or shortness of breath. 8 g 0   amLODipine (NORVASC) 10 MG tablet TAKE ONE TABLET BY MOUTH ONE TIME DAILY 90 tablet 0   Calcium Carb-Cholecalciferol (CALCIUM 500/VITAMIN D) 500-3.125 MG-MCG TABS Take by mouth.     cetirizine (ZYRTEC) 10 MG tablet TAKE ONE TABLET BY MOUTH ONE TIME DAILY 90 tablet 0   meclizine (ANTIVERT) 25 MG tablet Take 1 tablet (25 mg total) by mouth 3 (three) times daily as needed for dizziness. 30 tablet 0   omeprazole (PRILOSEC) 20 MG capsule TAKE ONE CAPSULE BY MOUTH ONE TIME DAILY 90 capsule 0   simvastatin (ZOCOR) 10 MG tablet TAKE ONE TABLET BY MOUTH AT BEDTIME 90 tablet 0   No current facility-administered medications for this visit.    Allergies   Allergen Reactions   Wound Dressing Adhesive Other (See Comments)    Skin tears   Codeine Rash   Steri-Strip Compound Benzoin [Benzoin] Itching, Rash and Other (See Comments)    Skin redness/skin blisters    Family History  Problem Relation Age of Onset   Hypertension Mother    Heart attack Mother    Cancer Maternal Grandfather     Social History   Socioeconomic History   Marital status: Single    Spouse name: Not on file   Number of children: Not on file   Years of education: Not on file   Highest education level: 12th grade  Occupational History   Occupation: Advertising account executive  Tobacco Use   Smoking status: Never   Smokeless tobacco: Never  Vaping Use   Vaping status: Never Used  Substance and Sexual Activity   Alcohol use: Never   Drug use: Never   Sexual activity: Not Currently    Birth control/protection: None  Other Topics Concern   Not on file  Social History Narrative   Not on file   Social Determinants of Health   Financial Resource Strain: Not on file  Food Insecurity: No Food Insecurity (05/01/2023)   Hunger Vital Sign    Worried About Running Out of Food in the Last Year: Never true    Ran  Out of Food in the Last Year: Never true  Transportation Needs: No Transportation Needs (05/01/2023)   PRAPARE - Administrator, Civil Service (Medical): No    Lack of Transportation (Non-Medical): No  Physical Activity: Insufficiently Active (05/01/2023)   Exercise Vital Sign    Days of Exercise per Week: 2 days    Minutes of Exercise per Session: 30 min  Stress: No Stress Concern Present (05/01/2023)   [employer] of Occupational Health - Occupational Stress Questionnaire    Feeling of Stress : Not at all  Social Connections: Moderately Isolated (05/01/2023)   Social Connection and Isolation Panel [NHANES]    Frequency of Communication with Friends and Family: Three times a week    Frequency of Social Gatherings with Friends and Family: Once a  week    Attends Religious Services: 1 to 4 times per year    Active Member of [employer] or Organizations: No    Attends Engineer, structural: Not on file    Marital Status: Divorced  Intimate Partner Violence: Not on file     Constitutional: Patient reports intermittent headaches.  Denies fever, malaise, fatigue, or abrupt weight changes.  HEENT: Denies eye pain, eye redness, ear pain, ringing in the ears, wax buildup, runny nose, nasal congestion, bloody nose, or sore throat. Respiratory: Denies difficulty breathing, shortness of breath, cough or sputum production.   Cardiovascular: Denies chest pain, chest tightness, palpitations or swelling in the hands or feet.  Gastrointestinal: Denies abdominal pain, bloating, constipation, diarrhea or blood in the stool.  GU: Denies urgency, frequency, pain with urination, burning sensation, blood in urine, odor or discharge. Musculoskeletal: Patient reports intermittent right foot swelling.  Denies decrease in range of motion, difficulty with gait, muscle pain or joint pain.  Skin: Patient reports rash of bilateral feet.  Denies redness, lesions or ulcercations.  Neurological: Denies dizziness, difficulty with memory, difficulty with speech or problems with balance and coordination.  Psych: Denies anxiety, depression, SI/HI.  No other specific complaints in a complete review of systems (except as listed in HPI above).     Objective:   Physical Exam  BP 116/72 (BP Location: Left Arm, Patient Position: Sitting, Cuff Size: Normal)   Pulse 81   Temp (!) 96 F (35.6 C) (Temporal)   Ht 5\' 5"  (1.651 m)   Wt 202 lb (91.6 kg)   SpO2 98%   BMI 33.61 kg/m   Wt Readings from Last 3 Encounters:  05/03/23 199 lb (90.3 kg)  01/18/23 189 lb (85.7 kg)  10/16/22 207 lb (93.9 kg)    General: Appears her stated age, obese, in NAD. Skin: Warm, dry and intact.  Nonblanchable petechial rash noted over the distal aspect of bilateral metatarsals of the  feet. HEENT: Head: normal shape and size; Eyes: sclera white, no icterus, conjunctiva pink, PERRLA and EOMs intact;   Neck:  Neck supple, trachea midline. No masses, lumps or thyromegaly present.  Cardiovascular: Normal rate and rhythm. S1,S2 noted.  No murmur, rubs or gallops noted. No JVD or BLE edema.  Pulmonary/Chest: Normal effort and positive vesicular breath sounds. No respiratory distress. No wheezes, rales or ronchi noted.  Abdomen: Soft and nontender. Normal bowel sounds.  Musculoskeletal: Strength 5/5 BUE/BLE.  No difficulty with gait.  Neurological: Alert and oriented. Cranial nerves II-XII grossly intact. Coordination normal.  Psychiatric: Mood and affect normal. Behavior is normal. Judgment and thought content normal.    BMET    Component Value Date/Time   NA  143 01/18/2023 0832   K 3.9 01/18/2023 0832   CL 105 01/18/2023 0832   CO2 28 01/18/2023 0832   GLUCOSE 98 01/18/2023 0832   BUN 12 01/18/2023 0832   CREATININE 0.78 01/18/2023 0832   CALCIUM 9.6 01/18/2023 0832   GFRNONAA >60 10/10/2018 1238   GFRAA >60 10/10/2018 1238    Lipid Panel     Component Value Date/Time   CHOL 164 01/18/2023 0832   TRIG 102 01/18/2023 0832   HDL 50 01/18/2023 0832   CHOLHDL 3.3 01/18/2023 0832   LDLCALC 94 01/18/2023 0832    CBC    Component Value Date/Time   WBC 7.1 01/18/2023 0832   RBC 4.90 01/18/2023 0832   HGB 13.9 01/18/2023 0832   HCT 42.3 01/18/2023 0832   PLT 267 01/18/2023 0832   MCV 86.3 01/18/2023 0832   MCH 28.4 01/18/2023 0832   MCHC 32.9 01/18/2023 0832   RDW 12.3 01/18/2023 0832    Hgb A1C Lab Results  Component Value Date   HGBA1C 5.8 (H) 01/18/2023            Assessment & Plan:   Preventative health maintenance:  Flu shot today Tetanus UTD Encouraged her to get her COVID booster Pap smear UTD Mammogram ordered-she will call to schedule Colon screening UTD Encouraged her to consume a balanced diet and exercise regimen Advised her  to see an eye doctor and dentist annually Will check CBC, c-Met, TSH, free T4, lipid, A1c today  Petechial rash of feet concerning for vasculitis:  Will check ANA, ESR and CRP today Rx for Pred taper x 9 days Notify me if symptoms persist or worsen  RTC in 6 months, follow-up chronic conditions Nicki Reaper, NP

## 2023-08-09 NOTE — Assessment & Plan Note (Signed)
Encouraged diet and exercise for weight loss ?

## 2023-08-11 LAB — LIPID PANEL
Cholesterol: 161 mg/dL (ref <200)
HDL: 45 mg/dL — ABNORMAL LOW (ref >=50)
LDL Cholesterol (Calc): 93 mg/dL
Non-HDL Cholesterol (Calc): 116 mg/dL (ref <130)
Total CHOL/HDL Ratio: 3.6 (calc) (ref <5.0)
Triglycerides: 135 mg/dL (ref <150)

## 2023-08-11 LAB — HEMOGLOBIN A1C
Hgb A1c MFr Bld: 6.3 %{Hb} — ABNORMAL HIGH (ref <5.7)
Mean Plasma Glucose: 134 mg/dL
eAG (mmol/L): 7.4 mmol/L

## 2023-08-11 LAB — TSH: TSH: 5.98 m[IU]/L — ABNORMAL HIGH

## 2023-08-11 LAB — COMPLETE METABOLIC PANEL WITH GFR
AG Ratio: 1.5 (calc) (ref 1.0–2.5)
ALT: 38 U/L — ABNORMAL HIGH (ref 6–29)
AST: 36 U/L — ABNORMAL HIGH (ref 10–35)
Albumin: 4.4 g/dL (ref 3.6–5.1)
Alkaline phosphatase (APISO): 121 U/L (ref 31–125)
BUN: 11 mg/dL (ref 7–25)
CO2: 28 mmol/L (ref 20–32)
Calcium: 9.6 mg/dL (ref 8.6–10.2)
Chloride: 104 mmol/L (ref 98–110)
Creat: 0.74 mg/dL (ref 0.50–0.99)
Globulin: 2.9 g/dL (ref 1.9–3.7)
Glucose, Bld: 112 mg/dL — ABNORMAL HIGH (ref 65–99)
Potassium: 4.1 mmol/L (ref 3.5–5.3)
Sodium: 141 mmol/L (ref 135–146)
Total Bilirubin: 0.6 mg/dL (ref 0.2–1.2)
Total Protein: 7.3 g/dL (ref 6.1–8.1)
eGFR: 99 mL/min/{1.73_m2} (ref >=60)

## 2023-08-11 LAB — CBC
HCT: 44.8 % (ref 35.0–45.0)
Hemoglobin: 14 g/dL (ref 11.7–15.5)
MCH: 26.9 pg — ABNORMAL LOW (ref 27.0–33.0)
MCHC: 31.3 g/dL — ABNORMAL LOW (ref 32.0–36.0)
MCV: 86.2 fL (ref 80.0–100.0)
MPV: 10.5 fL (ref 7.5–12.5)
Platelets: 238 10*3/uL (ref 140–400)
RBC: 5.2 10*6/uL — ABNORMAL HIGH (ref 3.80–5.10)
RDW: 12.9 % (ref 11.0–15.0)
WBC: 7.4 10*3/uL (ref 3.8–10.8)

## 2023-08-11 LAB — ANA: Anti Nuclear Antibody (ANA): POSITIVE — AB

## 2023-08-11 LAB — SEDIMENTATION RATE: Sed Rate: 22 mm/h — ABNORMAL HIGH (ref 0–20)

## 2023-08-11 LAB — ANTI-NUCLEAR AB-TITER (ANA TITER): ANA Titer 1: 1:80 {titer} — ABNORMAL HIGH

## 2023-08-11 LAB — C-REACTIVE PROTEIN: CRP: 11.4 mg/L — ABNORMAL HIGH (ref <8.0)

## 2023-08-11 LAB — T4, FREE: Free T4: 0.9 ng/dL (ref 0.8–1.8)

## 2023-08-12 ENCOUNTER — Encounter: Payer: Self-pay | Admitting: Internal Medicine

## 2023-08-12 DIAGNOSIS — R7982 Elevated C-reactive protein (CRP): Secondary | ICD-10-CM

## 2023-08-12 MED ORDER — LEVOTHYROXINE SODIUM 25 MCG PO TABS
25.0000 ug | ORAL_TABLET | Freq: Every day | ORAL | 1 refills | Status: DC
Start: 2023-08-12 — End: 2024-02-04

## 2023-08-13 NOTE — Addendum Note (Signed)
Addended by: Lorre Munroe on: 08/13/2023 10:13 AM   Modules accepted: Orders

## 2023-08-23 ENCOUNTER — Other Ambulatory Visit: Payer: BC Managed Care – PPO

## 2023-08-23 ENCOUNTER — Other Ambulatory Visit: Payer: Self-pay

## 2023-08-23 DIAGNOSIS — R7982 Elevated C-reactive protein (CRP): Secondary | ICD-10-CM

## 2023-08-24 LAB — C-REACTIVE PROTEIN: CRP: 15.8 mg/L — ABNORMAL HIGH (ref <8.0)

## 2023-08-26 ENCOUNTER — Encounter: Payer: Self-pay | Admitting: Internal Medicine

## 2023-08-26 NOTE — Addendum Note (Signed)
Addended by: Lorre Munroe on: 08/26/2023 09:10 AM   Modules accepted: Orders

## 2023-10-04 ENCOUNTER — Ambulatory Visit
Admission: RE | Admit: 2023-10-04 | Discharge: 2023-10-04 | Disposition: A | Discharge status: Home / Self Care | Payer: BC Managed Care – PPO | Source: Ambulatory Visit | Attending: Internal Medicine | Admitting: Internal Medicine

## 2023-10-04 DIAGNOSIS — Z1231 Encounter for screening mammogram for malignant neoplasm of breast: Secondary | ICD-10-CM | POA: Diagnosis present

## 2023-10-25 ENCOUNTER — Other Ambulatory Visit: Payer: Self-pay | Admitting: Internal Medicine

## 2023-10-25 DIAGNOSIS — Z0001 Encounter for general adult medical examination with abnormal findings: Secondary | ICD-10-CM

## 2023-10-25 NOTE — Telephone Encounter (Signed)
Requested Prescriptions  Pending Prescriptions Disp Refills   amLODipine (NORVASC) 10 MG tablet [Pharmacy Med Name: AMLODIPINE 10 MG TAB[*]] 90 tablet 0    Sig: TAKE ONE TABLET BY MOUTH ONE TIME DAILY     Cardiovascular: Calcium Channel Blockers 2 Passed - 10/25/2023 11:33 AM      Passed - Last BP in normal range    BP Readings from Last 1 Encounters:  08/09/23 116/72         Passed - Last Heart Rate in normal range    Pulse Readings from Last 1 Encounters:  08/09/23 81         Passed - Valid encounter within last 6 months    Recent Outpatient Visits           2 months ago Encounter for general adult medical examination with abnormal findings   [facility] [place], [place] W, NP   5 months ago BPPV (benign paroxysmal positional vertigo), right   [facility] [place], Salvadore Oxford, NP   9 months ago Mixed hyperlipidemia   [place] [place], Salvadore Oxford, NP   1 year ago Chronic cough   [place] [place], Salvadore Oxford, NP   1 year ago Nocturnal cough   [facility] [place], Salvadore Oxford, NP       Future Appointments             In 3 months [place], Salvadore Oxford, NP [facility]             simvastatin (ZOCOR) 10 MG tablet [Pharmacy Med Name: SIMVASTATIN 10 MG TAB[*]] 90 tablet 0    Sig: TAKE ONE TABLET BY MOUTH AT BEDTIME     Cardiovascular:  Antilipid - Statins Failed - 10/25/2023 11:33 AM      Failed - Lipid Panel in normal range within the last 12 months    Cholesterol  Date Value Ref Range Status  08/09/2023 161 <200 mg/dL Final   LDL Cholesterol (Calc)  Date Value Ref Range Status  08/09/2023 93 mg/dL (calc) Final    Comment:    Reference range: <100 . Desirable range <100 mg/dL for primary prevention;   <70 mg/dL for patients with CHD or diabetic patients  with > or = 2 CHD risk factors. Marland Kitchen LDL-C is now  calculated using the Martin-Hopkins  calculation, which is a validated novel method providing  better accuracy than the Friedewald equation in the  estimation of LDL-C.  Horald Pollen et al. Lenox Ahr. 8119;147(82): 2061-2068  (http://education.[facility].com/faq/FAQ164)    HDL  Date Value Ref Range Status  08/09/2023 45 (L) > OR = 50 mg/dL Final   Triglycerides  Date Value Ref Range Status  08/09/2023 135 <150 mg/dL Final         Passed - Patient is not pregnant      Passed - Valid encounter within last 12 months    Recent Outpatient Visits           2 months ago Encounter for general adult medical examination with abnormal findings   [facility] [place], [place] W, NP   5 months ago BPPV (benign paroxysmal positional vertigo), right   [facility] [place], Salvadore Oxford, NP   9 months ago Mixed hyperlipidemia   [place] [facility] [place], Salvadore Oxford, NP   1  year ago Chronic cough   [facility] [place], Salvadore Oxford, NP   1 year ago Nocturnal cough   [place] [facility] [place], Salvadore Oxford, NP       Future Appointments             In 3 months [place], Salvadore Oxford, NP [facility], [place]

## 2023-11-06 ENCOUNTER — Other Ambulatory Visit: Payer: Self-pay | Admitting: Internal Medicine

## 2023-11-08 NOTE — Telephone Encounter (Signed)
Requested Prescriptions  Pending Prescriptions Disp Refills   omeprazole (PRILOSEC) 20 MG capsule [Pharmacy Med Name: OMEPRAZOLE 20 MG CAP[**]] 90 capsule 0    Sig: TAKE ONE CAPSULE BY MOUTH ONE TIME DAILY     Gastroenterology: Proton Pump Inhibitors Passed - 11/08/2023  8:22 AM      Passed - Valid encounter within last 12 months    Recent Outpatient Visits           3 months ago Encounter for general adult medical examination with abnormal findings   [facility] [place], [place] W, NP   6 months ago BPPV (benign paroxysmal positional vertigo), right   [facility], Salvadore Oxford, NP   9 months ago Mixed hyperlipidemia   [place]  [facility] [place], Salvadore Oxford, NP   1 year ago Chronic cough   [facility] [place], Salvadore Oxford, NP   1 year ago Nocturnal cough   [place] [facility] [place], Salvadore Oxford, NP       Future Appointments             In 3 months Baity, Salvadore Oxford, NP  [facility]

## 2023-12-26 ENCOUNTER — Encounter: Payer: Self-pay | Admitting: Internal Medicine

## 2024-02-02 ENCOUNTER — Other Ambulatory Visit: Payer: Self-pay | Admitting: Internal Medicine

## 2024-02-03 NOTE — Telephone Encounter (Signed)
 Requested Prescriptions  Pending Prescriptions Disp Refills   levothyroxine (SYNTHROID) 25 MCG tablet [Pharmacy Med Name: LEVOTHYROXINE 25 MCG TAB[*]] 90 tablet 1    Sig: TAKE ONE TABLET BY MOUTH ONE TIME DAILY     Endocrinology:  Hypothyroid Agents Failed - 02/03/2024  5:27 PM      Failed - TSH in normal range and within 360 days    TSH  Date Value Ref Range Status  08/09/2023 5.98 (H) mIU/L Final    Comment:              Reference Range .           > or = 20 Years  0.40-4.50 .                Pregnancy Ranges           First trimester    0.26-2.66           Second trimester   0.55-2.73           Third trimester    0.43-2.91          Passed - Valid encounter within last 12 months    Recent Outpatient Visits           5 months ago Encounter for general adult medical examination with abnormal findings   [facility] [place], [place] W, NP   9 months ago BPPV (benign paroxysmal positional vertigo), right   [place] [facility] [place], Salvadore Oxford, NP   1 year ago Mixed hyperlipidemia   [place] [facility] [place], Salvadore Oxford, NP   1 year ago Chronic cough   [place] [facility] [place], Salvadore Oxford, NP   1 year ago Nocturnal cough   [place] [facility] [place], Salvadore Oxford, NP       Future Appointments             In 4 days Sampson Si, Salvadore Oxford, NP [place]             amLODipine (NORVASC) 10 MG tablet [Pharmacy Med Name: AMLODIPINE 10 MG TAB[*]] 90 tablet 0    Sig: TAKE ONE TABLET BY MOUTH ONE TIME DAILY     Cardiovascular: Calcium Channel Blockers 2 Passed - 02/03/2024  5:27 PM      Passed - Last BP in normal range    BP Readings from Last 1 Encounters:  08/09/23 116/72         Passed - Last Heart Rate in normal range    Pulse Readings from Last 1 Encounters:  08/09/23 81         Passed - Valid encounter within last 6 months    Recent  Outpatient Visits           5 months ago Encounter for general adult medical examination with abnormal findings   [place] [place], [place], NP   9 months ago BPPV (benign paroxysmal positional vertigo), right   [place] [facility] [place], Salvadore Oxford, NP   1 year ago Mixed hyperlipidemia   [facility] [place], Salvadore Oxford, NP   1 year ago Chronic cough   [place] [facility] [place], Salvadore Oxford, NP   1 year ago Nocturnal cough    [facility] [place], Salvadore Oxford, [place]  Future Appointments             In 4 days Baity, Salvadore Oxford, NP [place]

## 2024-02-03 NOTE — Telephone Encounter (Signed)
 Requested medications are due for refill today.  yes  Requested medications are on the active medications list.  yes  Last refill. 08/12/2023  #90 1 rf  Future visit scheduled.   yes  Notes to clinic.  Abnormal labs.    Requested Prescriptions  Pending Prescriptions Disp Refills   levothyroxine (SYNTHROID) 25 MCG tablet [Pharmacy Med Name: LEVOTHYROXINE 25 MCG TAB[*]] 90 tablet 1    Sig: TAKE ONE TABLET BY MOUTH ONE TIME DAILY     Endocrinology:  Hypothyroid Agents Failed - 02/03/2024  5:27 PM      Failed - TSH in normal range and within 360 days    TSH  Date Value Ref Range Status  08/09/2023 5.98 (H) mIU/L Final    Comment:              Reference Range .           > or = 20 Years  0.40-4.50 .                Pregnancy Ranges           First trimester    0.26-2.66           Second trimester   0.55-2.73           Third trimester    0.43-2.91          Passed - Valid encounter within last 12 months    Recent Outpatient Visits           5 months ago Encounter for general adult medical examination with abnormal findings   [facility] [place], [place] W, NP   9 months ago BPPV (benign paroxysmal positional vertigo), right   [facility] [place], Salv[place], NP   1 year ago Mixed hyperlipidemia   [facility] [place], Salvadore [place], NP   1 year ago Chronic cough   [facility] [place], Salvadore Oxford, NP   1 year ago Nocturnal cough   [facility] [place], Salvadore [place], NP       Future Appointments             In 4 days [place], Salvadore [place], NP [place] [facility]            Signed Prescriptions Disp Refills   amLODipine (NORVASC) 10 MG tablet 90 tablet 0    Sig: TAKE ONE TABLET BY MOUTH ONE TIME DAILY     Cardiovascular: Calcium Channel Blockers 2 Passed - 02/03/2024  5:27 PM      Passed - Last BP in normal range    BP Readings  from Last 1 Encounters:  08/09/23 116/72         Passed - Last Heart Rate in normal range    Pulse Readings from Last 1 Encounters:  08/09/23 81         Passed - Valid encounter within last 6 months    Recent Outpatient Visits           5 months ago Encounter for general adult medical examination with abnormal findings   [facility] [place], [place] W, NP   9 months ago BPPV (benign paroxysmal positional vertigo), right   [facility] [place], Salvadore Oxford, NP   1 year ago Mixed hyperlipidemia   [facility] [place], [place]  W, NP   1 year ago Chronic cough   [facility] [place], Salvadore Oxford, NP   1 year ago Nocturnal cough   [facility] [place], Salvadore Oxford, NP       Future Appointments             In 4 days [place], Salvadore Oxford, NP [place] [facility], [place]

## 2024-02-06 ENCOUNTER — Encounter: Payer: Self-pay | Admitting: Internal Medicine

## 2024-02-07 ENCOUNTER — Ambulatory Visit: Payer: BC Managed Care – PPO | Admitting: Internal Medicine

## 2024-02-07 ENCOUNTER — Encounter: Payer: Self-pay | Admitting: Internal Medicine

## 2024-02-07 VITALS — BP 112/74 | Ht 65.0 in | Wt 195.2 lb

## 2024-02-07 DIAGNOSIS — E782 Mixed hyperlipidemia: Secondary | ICD-10-CM

## 2024-02-07 DIAGNOSIS — M349 Systemic sclerosis, unspecified: Secondary | ICD-10-CM

## 2024-02-07 DIAGNOSIS — K219 Gastro-esophageal reflux disease without esophagitis: Secondary | ICD-10-CM

## 2024-02-07 DIAGNOSIS — Z6832 Body mass index (BMI) 32.0-32.9, adult: Secondary | ICD-10-CM

## 2024-02-07 DIAGNOSIS — E66811 Obesity, class 1: Secondary | ICD-10-CM

## 2024-02-07 DIAGNOSIS — G43C1 Periodic headache syndromes in child or adult, intractable: Secondary | ICD-10-CM

## 2024-02-07 DIAGNOSIS — E041 Nontoxic single thyroid nodule: Secondary | ICD-10-CM

## 2024-02-07 DIAGNOSIS — E039 Hypothyroidism, unspecified: Secondary | ICD-10-CM | POA: Diagnosis not present

## 2024-02-07 DIAGNOSIS — I10 Essential (primary) hypertension: Secondary | ICD-10-CM

## 2024-02-07 DIAGNOSIS — G8929 Other chronic pain: Secondary | ICD-10-CM

## 2024-02-07 DIAGNOSIS — R7303 Prediabetes: Secondary | ICD-10-CM

## 2024-02-07 DIAGNOSIS — M25552 Pain in left hip: Secondary | ICD-10-CM

## 2024-02-07 DIAGNOSIS — M545 Other chronic pain: Secondary | ICD-10-CM

## 2024-02-07 DIAGNOSIS — E6609 Other obesity due to excess calories: Secondary | ICD-10-CM

## 2024-02-07 MED ORDER — OMEPRAZOLE 20 MG PO CPDR: 20.0000 mg | DELAYED_RELEASE_CAPSULE | Freq: Every day | ORAL | 1 refills | Status: DC

## 2024-02-07 NOTE — Assessment & Plan Note (Signed)
 TSH and free T4 today Continue levothyroxine, will adjust if needed based on lab Will obtain thyroid ultrasound given concern for thyroid nodule

## 2024-02-07 NOTE — Assessment & Plan Note (Signed)
 C-Met and lipid profile today Encouraged her to consume low-fat diet Continue simvastatin

## 2024-02-07 NOTE — Patient Instructions (Signed)

## 2024-02-07 NOTE — Assessment & Plan Note (Signed)
 A1c today Encourage low-carb diet and exercise for weight loss

## 2024-02-07 NOTE — Assessment & Plan Note (Signed)
 Encouraged regular stretching Encourage weight loss as this can reduce back pain Okay to continue Aleve OTC as needed

## 2024-02-07 NOTE — Assessment & Plan Note (Signed)
Avoid foods that trigger reflux Encourage weight loss as this can help reduce reflux symptoms Continue omeprazole 

## 2024-02-07 NOTE — Assessment & Plan Note (Signed)
 Continue Plaquenil per dermatology/rheumatology

## 2024-02-07 NOTE — Progress Notes (Signed)
 Subjective:    Patient ID: Dana Figueroa, female    DOB: 1976-05-28, 48 y.o.   MRN: [id number]  HPI  Patient presents to clinic today for follow-up of chronic conditions.  HTN: Her BP today is 112/74.  She is taking amlodipine as prescribed.  There is no ECG on file.  Hypothyroidism: She denies any issues on her current dose of levothyroxine.  She does not follow with endocrinology.  Migraines: These occur 1 x every 6 months.  Triggered by lack of caffeine.  She gets good relief with excedrin migraine and ice packs.  She does not follow with neurology.  Scleroderma: She is taking plaquenil. She is following with dermatology and rheumatology.  GERD: Triggered by pepperoni.  She denies breakthrough on omeprazole.  There is no upper GI on file.  HLD: Her last LDL was 93, triglycerides 027, 07/2023.  She denies myalgias on simvastatin.  She tries to consume low-fat diet.  Prediabetes: Her last A1c was 6.3%, 07/2023.  She is not taking any oral diabetic medication at this time.  She does not check her sugars.  Chronic low back/left hip pain: s/p hip injection for bursitis with some improvement. She is stretching daily. She takes aleve OTC as needed with some relief of symptoms. She follows with orthopedics.  Review of Systems     Past Medical History:  Diagnosis Date   Headache    migraines   History of kidney stones    History of left breast biopsy    Hypothyroidism    Ovarian cyst    Scleroderma (HCC)     Current Outpatient Medications  Medication Sig Dispense Refill   albuterol (VENTOLIN HFA) 108 (90 Base) MCG/ACT inhaler Inhale 2 puffs into the lungs every 6 (six) hours as needed for wheezing or shortness of breath. 8 g 0   amLODipine (NORVASC) 10 MG tablet TAKE ONE TABLET BY MOUTH ONE TIME DAILY 90 tablet 0   Calcium Carb-Cholecalciferol (CALCIUM 500/VITAMIN D) 500-3.125 MG-MCG TABS Take by mouth.     cetirizine (ZYRTEC) 10 MG tablet TAKE ONE TABLET BY MOUTH ONE TIME  DAILY 90 tablet 0   levothyroxine (SYNTHROID) 25 MCG tablet TAKE ONE TABLET BY MOUTH ONE TIME DAILY 90 tablet 1   meclizine (ANTIVERT) 25 MG tablet Take 1 tablet (25 mg total) by mouth 3 (three) times daily as needed for dizziness. 30 tablet 0   omeprazole (PRILOSEC) 20 MG capsule TAKE ONE CAPSULE BY MOUTH ONE TIME DAILY 90 capsule 0   predniSONE (DELTASONE) 10 MG tablet Take 3 tabs on days 1-3, 2 tabs on days 4-6, 1 tab on days 7-9 18 tablet 0   simvastatin (ZOCOR) 10 MG tablet TAKE ONE TABLET BY MOUTH AT BEDTIME 90 tablet 0   No current facility-administered medications for this visit.    Allergies  Allergen Reactions   Wound Dressing Adhesive Other (See Comments)    Skin tears   Codeine Rash   Steri-Strip Compound Benzoin [Benzoin] Itching, Rash and Other (See Comments)    Skin redness/skin blisters    Family History  Problem Relation Age of Onset   Hypertension Mother    Heart attack Mother    Cancer Maternal Grandfather     Social History   Socioeconomic History   Marital status: Single    Spouse name: Not on file   Number of children: Not on file   Years of education: Not on file   Highest education level: 12th grade  Occupational History  Occupation: Advertising account executive  Tobacco Use   Smoking status: Never   Smokeless tobacco: Never  Vaping Use   Vaping status: Never Used  Substance and Sexual Activity   Alcohol use: Never   Drug use: Never   Sexual activity: Not Currently    Birth control/protection: None  Other Topics Concern   Not on file  Social History Narrative   Not on file   Social Drivers of Health   Financial Resource Strain: Low Risk  (02/03/2024)   Overall Financial Resource Strain (CARDIA)    Difficulty of Paying Living Expenses: Not hard at all  Food Insecurity: No Food Insecurity (02/03/2024)   Hunger Vital Sign    Worried About Running Out of Food in the Last Year: Never true    Ran Out of Food in the Last Year: Never true  Transportation  Needs: No Transportation Needs (02/03/2024)   PRAPARE - Administrator, Civil Service (Medical): No    Lack of Transportation (Non-Medical): No  Physical Activity: Sufficiently Active (02/03/2024)   Exercise Vital Sign    Days of Exercise per Week: 5 days    Minutes of Exercise per Session: 40 min  Stress: No Stress Concern Present (02/03/2024)   [employer] of Occupational Health - Occupational Stress Questionnaire    Feeling of Stress : Not at all  Social Connections: Moderately Isolated (02/03/2024)   Social Connection and Isolation Panel [NHANES]    Frequency of Communication with Friends and Family: Once a week    Frequency of Social Gatherings with Friends and Family: Once a week    Attends Religious Services: 1 to 4 times per year    Active Member of [employer] or Organizations: Yes    Attends Banker Meetings: Not on file    Marital Status: Divorced  Intimate Partner Violence: Not on file     Constitutional: Patient reports intermittent headaches.  Denies fever, malaise, fatigue, or abrupt weight changes.  HEENT: Denies eye pain, eye redness, ear pain, ringing in the ears, wax buildup, runny nose, nasal congestion, bloody nose, or sore throat. Respiratory: Denies difficulty breathing, shortness of breath, cough or sputum production.   Cardiovascular: Denies chest pain, chest tightness, palpitations or swelling in the hands or feet.  Gastrointestinal: Denies abdominal pain, bloating, constipation, diarrhea or blood in the stool.  GU: Denies urgency, frequency, pain with urination, burning sensation, blood in urine, odor or discharge. Musculoskeletal: Pt reports low back pain, left hip pain. Denies decrease in range of motion, difficulty with gait, or joint swelling.  Skin: Denies redness, rashes, lesions or ulcercations.  Neurological: Denies dizziness, difficulty with memory, difficulty with speech or problems with balance and coordination.  Psych: Denies  anxiety, depression, SI/HI.  No other specific complaints in a complete review of systems (except as listed in HPI above).  Objective:   Physical Exam  BP 112/74 (BP Location: Left Arm, Patient Position: Sitting, Cuff Size: Normal)   Ht 5\' 5"  (1.651 m)   Wt 195 lb 3.2 oz (88.5 kg)   BMI 32.48 kg/m    Wt Readings from Last 3 Encounters:  08/09/23 202 lb (91.6 kg)  05/03/23 199 lb (90.3 kg)  01/18/23 189 lb (85.7 kg)    General: Appears her stated age, obese, in NAD. Skin: Warm, dry and intact.  HEENT: Head: normal shape and size; Eyes: sclera white, no icterus, conjunctiva pink, PERRLA and EOMs intact;  Neck:  Neck supple, trachea midline.  Possible thyroid nodule noted just  right of the midline. Cardiovascular: Normal rate and rhythm. S1,S2 noted.  No murmur, rubs or gallops noted. No JVD or BLE edema.  Pulmonary/Chest: Normal effort and positive vesicular breath sounds. No respiratory distress. No wheezes, rales or ronchi noted.  Abdomen: Soft and nontender. Normal bowel sounds.  Musculoskeletal:  No difficulty with gait.  Neurological: Alert and oriented. Coordination normal.  Psychiatric: Mood and affect normal. Behavior is normal. Judgment and thought content normal.    BMET    Component Value Date/Time   NA 141 08/09/2023 0819   K 4.1 08/09/2023 0819   CL 104 08/09/2023 0819   CO2 28 08/09/2023 0819   GLUCOSE 112 (H) 08/09/2023 0819   BUN 11 08/09/2023 0819   CREATININE 0.74 08/09/2023 0819   CALCIUM 9.6 08/09/2023 0819   GFRNONAA >60 10/10/2018 1238   GFRAA >60 10/10/2018 1238    Lipid Panel     Component Value Date/Time   CHOL 161 08/09/2023 0819   TRIG 135 08/09/2023 0819   HDL 45 (L) 08/09/2023 0819   CHOLHDL 3.6 08/09/2023 0819   LDLCALC 93 08/09/2023 0819    CBC    Component Value Date/Time   WBC 7.4 08/09/2023 0819   RBC 5.20 (H) 08/09/2023 0819   HGB 14.0 08/09/2023 0819   HCT 44.8 08/09/2023 0819   PLT 238 08/09/2023 0819   MCV 86.2  08/09/2023 0819   MCH 26.9 (L) 08/09/2023 0819   MCHC 31.3 (L) 08/09/2023 0819   RDW 12.9 08/09/2023 0819    Hgb A1C Lab Results  Component Value Date   HGBA1C 6.3 (H) 08/09/2023           Assessment & Plan:      RTC in 6 months for your annual exam Nicki Reaper, NP

## 2024-02-07 NOTE — Assessment & Plan Note (Signed)
 Encouraged regular stretching and core strengthening Encourage weight loss as this can reduce back pain Okay to continue Aleve OTC as needed

## 2024-02-07 NOTE — Assessment & Plan Note (Signed)
 Encouraged diet and exercise for weight loss ?

## 2024-02-07 NOTE — Assessment & Plan Note (Signed)
Controlled on amlodipine Reinforced DASH diet and exercise for weight loss C-Met today 

## 2024-02-07 NOTE — Assessment & Plan Note (Signed)
 Try to avoid caffeine withdrawal Continue excedrin as needed

## 2024-02-08 LAB — COMPREHENSIVE METABOLIC PANEL WITH GFR
AG Ratio: 1.7 (calc) (ref 1.0–2.5)
ALT: 19 U/L (ref 6–29)
AST: 17 U/L (ref 10–35)
Albumin: 4.5 g/dL (ref 3.6–5.1)
Alkaline phosphatase (APISO): 85 U/L (ref 31–125)
BUN: 12 mg/dL (ref 7–25)
CO2: 30 mmol/L (ref 20–32)
Calcium: 9.6 mg/dL (ref 8.6–10.2)
Chloride: 104 mmol/L (ref 98–110)
Creat: 0.77 mg/dL (ref 0.50–0.99)
Globulin: 2.6 g/dL (ref 1.9–3.7)
Glucose, Bld: 85 mg/dL (ref 65–99)
Potassium: 4.3 mmol/L (ref 3.5–5.3)
Sodium: 143 mmol/L (ref 135–146)
Total Bilirubin: 0.6 mg/dL (ref 0.2–1.2)
Total Protein: 7.1 g/dL (ref 6.1–8.1)
eGFR: 95 mL/min/{1.73_m2} (ref >=60)

## 2024-02-08 LAB — T4, FREE: Free T4: 1.3 ng/dL (ref 0.8–1.8)

## 2024-02-08 LAB — CBC
HCT: 45.2 % — ABNORMAL HIGH (ref 35.0–45.0)
Hemoglobin: 14.6 g/dL (ref 11.7–15.5)
MCH: 27.4 pg (ref 27.0–33.0)
MCHC: 32.3 g/dL (ref 32.0–36.0)
MCV: 85 fL (ref 80.0–100.0)
MPV: 10.4 fL (ref 7.5–12.5)
Platelets: 236 10*3/uL (ref 140–400)
RBC: 5.32 10*6/uL — ABNORMAL HIGH (ref 3.80–5.10)
RDW: 13.1 % (ref 11.0–15.0)
WBC: 7.4 10*3/uL (ref 3.8–10.8)

## 2024-02-08 LAB — LIPID PANEL
Cholesterol: 155 mg/dL (ref <200)
HDL: 63 mg/dL (ref >=50)
LDL Cholesterol (Calc): 77 mg/dL
Non-HDL Cholesterol (Calc): 92 mg/dL (ref <130)
Total CHOL/HDL Ratio: 2.5 (calc) (ref <5.0)
Triglycerides: 66 mg/dL (ref <150)

## 2024-02-08 LAB — HEMOGLOBIN A1C
Hgb A1c MFr Bld: 6.1 %{Hb} — ABNORMAL HIGH (ref <5.7)
Mean Plasma Glucose: 128 mg/dL
eAG (mmol/L): 7.1 mmol/L

## 2024-02-08 LAB — TSH: TSH: 4.25 m[IU]/L

## 2024-02-10 ENCOUNTER — Encounter: Payer: Self-pay | Admitting: Internal Medicine

## 2024-02-10 ENCOUNTER — Other Ambulatory Visit: Payer: Self-pay | Admitting: Internal Medicine

## 2024-02-10 DIAGNOSIS — Z0001 Encounter for general adult medical examination with abnormal findings: Secondary | ICD-10-CM

## 2024-02-11 ENCOUNTER — Ambulatory Visit
Admission: RE | Admit: 2024-02-11 | Discharge: 2024-02-11 | Disposition: A | Discharge status: Home / Self Care | Source: Ambulatory Visit | Attending: Internal Medicine | Admitting: Internal Medicine

## 2024-02-11 DIAGNOSIS — E041 Nontoxic single thyroid nodule: Secondary | ICD-10-CM | POA: Diagnosis present

## 2024-02-11 DIAGNOSIS — E039 Hypothyroidism, unspecified: Secondary | ICD-10-CM | POA: Diagnosis present

## 2024-02-12 NOTE — Telephone Encounter (Signed)
 Requested Prescriptions  Pending Prescriptions Disp Refills   simvastatin (ZOCOR) 10 MG tablet [Pharmacy Med Name: SIMVASTATIN 10 MG TAB[*]] 90 tablet 1    Sig: TAKE ONE TABLET BY MOUTH AT BEDTIME     Cardiovascular:  Antilipid - Statins Failed - 02/12/2024  5:47 PM      Failed - Lipid Panel in normal range within the last 12 months    Cholesterol  Date Value Ref Range Status  02/07/2024 155 <200 mg/dL Final   LDL Cholesterol (Calc)  Date Value Ref Range Status  02/07/2024 77 mg/dL (calc) Final    Comment:    Reference range: <100 . Desirable range <100 mg/dL for primary prevention;   <70 mg/dL for patients with CHD or diabetic patients  with > or = 2 CHD risk factors. Marland Kitchen LDL-C is now calculated using the Martin-Hopkins  calculation, which is a validated novel method providing  better accuracy than the Friedewald equation in the  estimation of LDL-C.  Horald Pollen et al. Lenox Ahr. 0981;191(47): 2061-2068  (http://education.[employer].com/faq/FAQ164)    HDL  Date Value Ref Range Status  02/07/2024 63 > OR = 50 mg/dL Final   Triglycerides  Date Value Ref Range Status  02/07/2024 66 <150 mg/dL Final         Passed - Patient is not pregnant      Passed - Valid encounter within last 12 months    Recent Outpatient Visits           5 days ago Prediabetes   [facility] [place], Salv[place], [place]

## 2024-02-17 ENCOUNTER — Encounter: Payer: Self-pay | Admitting: Internal Medicine

## 2024-02-17 DIAGNOSIS — E041 Nontoxic single thyroid nodule: Secondary | ICD-10-CM

## 2024-02-17 NOTE — Progress Notes (Signed)
 Patient for Dana Figueroa guided FNA Mid LT Thyroid Nodule Biopsy on Tues 02/18/24, I called and spoke with the patient on the phone and gave pre-procedure instructions. Pt was made aware to be here at 2p and check in at the [facility] registration desk. Pt stated understanding. Called 02/17/24

## 2024-02-18 ENCOUNTER — Ambulatory Visit
Admission: RE | Admit: 2024-02-18 | Discharge: 2024-02-18 | Disposition: A | Discharge status: Home / Self Care | Source: Ambulatory Visit | Attending: Internal Medicine | Admitting: Internal Medicine

## 2024-02-18 VITALS — BP 130/87 | HR 68

## 2024-02-18 DIAGNOSIS — E041 Nontoxic single thyroid nodule: Secondary | ICD-10-CM | POA: Diagnosis present

## 2024-02-18 MED ORDER — LIDOCAINE HCL (PF) 1 % IJ SOLN
10.0000 mL | Freq: Once | INTRAMUSCULAR | Status: AC
  Administered 2024-02-18: 10 mL via SUBCUTANEOUS
  Filled 2024-02-18: qty 10

## 2024-02-18 NOTE — Procedures (Signed)
 PROCEDURE SUMMARY:  Using direct ultrasound guidance, 6 passes were made using 25 g needles into the nodule within the mid left lobe of the thyroid.   Ultrasound was used to confirm needle placements on all occasions.   EBL = trace  Specimens were sent to Pathology for analysis.  See procedure note under Imaging tab in Epic for full procedure details.    Electronically Signed: Sable Feil, PA-C 02/18/2024, 2:21 PM

## 2024-02-19 ENCOUNTER — Ambulatory Visit

## 2024-02-19 LAB — CYTOLOGY - NON PAP

## 2024-05-03 ENCOUNTER — Other Ambulatory Visit: Payer: Self-pay | Admitting: Internal Medicine

## 2024-05-05 NOTE — Telephone Encounter (Signed)
 Requested Prescriptions  Pending Prescriptions Disp Refills   amLODipine  (NORVASC ) 10 MG tablet [Pharmacy Med Name: AMLODIPINE  10 MG TAB[*]] 90 tablet 1    Sig: TAKE ONE TABLET BY MOUTH ONE TIME DAILY     Cardiovascular: Calcium Channel Blockers 2 Passed - 05/05/2024  2:46 PM      Passed - Last BP in normal range    BP Readings from Last 1 Encounters:  02/18/24 130/87         Passed - Last Heart Rate in normal range    Pulse Readings from Last 1 Encounters:  02/18/24 68         Passed - Valid encounter within last 6 months    Recent Outpatient Visits           2 months ago Prediabetes   [facility] [place], [place], [place]

## 2024-06-18 ENCOUNTER — Ambulatory Visit: Payer: Self-pay

## 2024-06-18 ENCOUNTER — Ambulatory Visit: Admitting: Internal Medicine

## 2024-06-18 VITALS — BP 124/78 | Ht 65.0 in | Wt 214.0 lb

## 2024-06-18 DIAGNOSIS — S61012A Laceration without foreign body of left thumb without damage to nail, initial encounter: Secondary | ICD-10-CM

## 2024-06-18 DIAGNOSIS — Z23 Encounter for immunization: Secondary | ICD-10-CM

## 2024-06-18 NOTE — Patient Instructions (Signed)
Nonsutured Laceration Care A laceration is a cut that may go through all layers of the skin and into the tissue that is right under the skin. A laceration is usually stitched up (sutured) or closed with adhesive strips or skin glue shortly after the injury happens. However, if the wound is dirty or if several hours pass before medical treatment is provided, it is likely that bacteria will enter the wound. Closing a laceration after bacteria have entered it increases the risk for infection. In these cases, your health care provider may leave the laceration open (nonsutured) and cover it with a bandage (dressing). This type of treatment helps prevent infection and allows the wound to heal from the deepest layer of tissue damage up to the surface. Nonsutured healing is also known as secondary wound healing. This is more common for wounds that involve loss of tissue, are irregular in shape and size, or are on surfaces of the body where movement makes sutures or other closure methods impossible. How to care for your nonsutured laceration Follow instructions from your health care provider about how to take care of your wound. Keep the wound clean and dry. Change any dressings as told by your health care provider. This includes changing the dressing when it starts to smell, or when it gets wet or dirty. Clean the wound one time each day, or as often as told by your health care provider. To clean your wound: Wash your hands with soap and water for at least 20 seconds before and after touching your wound or changing your dressing. If soap and water are not available, use hand sanitizer. Remove any dressing as told by your health care provider. Clean the wound with water or irrigation solution as told by your health care provider. Pat the wound dry with a clean towel. Do not rub the wound. Apply a thin layer of antibiotic ointment or another topical ointment to the wound as told by your health care provider. This  will prevent infection and keep the dressing from sticking to the wound. Apply a new dressing as told by your health care provider. Check your wound every day for signs of infection. Watch for: More redness, swelling, or pain. Fluid or blood. Warmth. Pus or a bad smell. Do not take baths, swim, or do anything that puts your wound underwater until your health care provider approves. Do not scratch or pick at the wound. Do not usedisinfectants or antiseptics, such as rubbing alcohol, to clean your wound unless told by your health care provider. Follow these instructions at home: Medicines Take over-the-counter and prescription medicines only as told by your health care provider. If you were prescribed an antibiotic medicine, take or apply it as told by your health care provider. Do not stop using the antibiotic even if your condition improves. Managing pain and swelling If directed, put ice on the injured area. To do this: Put ice in a plastic bag. Place a towel between your skin and the bag. Leave the ice on for 20 minutes, 2-3 times a day. Remove the ice if your skin turns bright red. This is very important. If you cannot feel pain, heat, or cold, you have a greater risk of damage to the area. Raise (elevate) the injured area above the level of your heart while you are sitting or lying down. General instructions Avoid any activity that could cause your laceration to reopen. Keep all follow-up visits. This is important. Contact a health care provider if: You received  a tetanus shot and you have swelling, severe pain, redness, or bleeding at the injection site. Your pain is not controlled with medicine. You have any of these signs of infection: More redness, swelling, or pain around your wound. Fluid or blood coming from your wound. Warmth coming from your wound. Pus or a bad smell coming from your wound. A fever. You notice something coming out of the wound, such as wood or  glass. You notice a change in the color of your skin near your wound. You develop a new rash. You need to change the dressing often. You develop numbness around your wound. Get help right away if: Your pain suddenly increases and is severe. You develop severe swelling around the wound. The wound is on your hand or foot, and you cannot properly move a finger or toe. The wound is on your hand or foot, and you notice that your fingers or toes look pale or bluish. You have a red streak going away from your wound. You develop painful lumps near the wound or on skin anywhere else on your body. Summary A laceration is a cut that may go through all layers of the skin and into the tissue that is right under the skin. It is usually closed with stitches, tape, or skin glue shortly after the injury happens. If a wound is dirty or if several hours pass before medical treatment is provided, the laceration may be kept open (nonsutured) and covered with a bandage. Nonsutured laceration helps prevent infection and allows the wound to heal from the deepest layer of tissue damage up to the surface. Follow instructions from your health care provider about how to take care of your wound. This information is not intended to replace advice given to you by your health care provider. Make sure you discuss any questions you have with your health care provider. Document Revised: 01/05/2021 Document Reviewed: 01/05/2021 Elsevier Patient Education  2024 [employer].

## 2024-06-18 NOTE — Progress Notes (Signed)
 Subjective:    Patient ID: Dana Figueroa, female    DOB: [birth year], 48 y.o.   MRN: [id number]  HPI  Discussed the use of AI scribe software for clinical note transcription with the patient, who gave verbal consent to proceed.  Dana Figueroa is a 48 year old female who presents for evaluation of a cut and tetanus status.  Laceration of the finger - Sustained a cut to the finger on a cat food lid yesterday morning around 6 or 7 AM - Initial bleeding was significant at the time of injury and recurred when changing the Band-Aid today - Bleeding controlled with direct pressure and elevation of the thumb above the head - Currently managing the wound with Neosporin  and Band-Aids - Concerned about wound cleanliness and infection prevention  Tetanus immunization status - Last tetanus vaccination received in 2019  Medication use relevant to bleeding - No current use of aspirin  or blood thinners       Review of Systems   Past Medical History:  Diagnosis Date   Headache    migraines   History of kidney stones    History of left breast biopsy    Hypothyroidism    Ovarian cyst    Scleroderma (HCC)     Current Outpatient Medications  Medication Sig Dispense Refill   amLODipine  (NORVASC ) 10 MG tablet TAKE ONE TABLET BY MOUTH ONE TIME DAILY 90 tablet 1   Calcium Carb-Cholecalciferol (CALCIUM 500/VITAMIN D) 500-3.125 MG-MCG TABS Take by mouth.     cetirizine  (ZYRTEC ) 10 MG tablet TAKE ONE TABLET BY MOUTH ONE TIME DAILY 90 tablet 0   hydroxychloroquine (PLAQUENIL) 200 MG tablet Take 200 mg by mouth 2 (two) times daily.     levothyroxine  (SYNTHROID ) 25 MCG tablet TAKE ONE TABLET BY MOUTH ONE TIME DAILY 90 tablet 1   meclizine  (ANTIVERT ) 25 MG tablet Take 1 tablet (25 mg total) by mouth 3 (three) times daily as needed for dizziness. 30 tablet 0   omeprazole  (PRILOSEC) 20 MG capsule Take 1 capsule (20 mg total) by mouth daily. 90 capsule 1   simvastatin  (ZOCOR ) 10 MG tablet  TAKE ONE TABLET BY MOUTH AT BEDTIME 90 tablet 1   No current facility-administered medications for this visit.    Allergies  Allergen Reactions   Wound Dressing Adhesive Other (See Comments)    Skin tears   Codeine Rash   Steri-Strip Compound Benzoin [Benzoin] Itching, Rash and Other (See Comments)    Skin redness/skin blisters    Family History  Problem Relation Age of Onset   Hypertension Mother    Heart attack Mother    Cancer Maternal Grandfather     Social History   Socioeconomic History   Marital status: Single    Spouse name: Not on file   Number of children: Not on file   Years of education: Not on file   Highest education level: 12th grade  Occupational History   Occupation: Advertising account executive  Tobacco Use   Smoking status: Never   Smokeless tobacco: Never  Vaping Use   Vaping status: Never Used  Substance and Sexual Activity   Alcohol use: Never   Drug use: Never   Sexual activity: Not Currently    Birth control/protection: None  Other Topics Concern   Not on file  Social History Narrative   Not on file   Social Drivers of Health   Financial Resource Strain: Low Risk  (06/18/2024)   Overall Financial Resource Strain (CARDIA)  Difficulty of Paying Living Expenses: Not hard at all  Food Insecurity: No Food Insecurity (06/18/2024)   Hunger Vital Sign    Worried About Running Out of Food in the Last Year: Never true    Ran Out of Food in the Last Year: Never true  Transportation Needs: No Transportation Needs (06/18/2024)   PRAPARE - Administrator, Civil Service (Medical): No    Lack of Transportation (Non-Medical): No  Physical Activity: Insufficiently Active (06/18/2024)   Exercise Vital Sign    Days of Exercise per Week: 4 days    Minutes of Exercise per Session: 30 min  Stress: No Stress Concern Present (06/18/2024)   [employer] of Occupational Health - Occupational Stress Questionnaire    Feeling of Stress: Only a little  Social  Connections: Socially Isolated (06/18/2024)   Social Connection and Isolation Panel    Frequency of Communication with Friends and Family: Once a week    Frequency of Social Gatherings with Friends and Family: Once a week    Attends Religious Services: 1 to 4 times per year    Active Member of [employer] or Organizations: No    Attends Engineer, structural: Not on file    Marital Status: Divorced  Intimate Partner Violence: Not on file     Constitutional: Denies fever, malaise, fatigue, headache or abrupt weight changes.  Respiratory: Denies difficulty breathing, shortness of breath, cough or sputum production.   Cardiovascular: Denies chest pain, chest tightness, palpitations or swelling in the hands or feet.  Musculoskeletal: Denies decrease in range of motion, difficulty with gait, muscle pain or joint pain and swelling.  Skin: Patient reports laceration to the index thumb.  Denies redness, rashes, lesions or ulcercations.    No other specific complaints in a complete review of systems (except as listed in HPI above).      Objective:   Physical Exam BP 124/78 (BP Location: Left Arm, Patient Position: Sitting, Cuff Size: Normal)   Ht 5' 5 (1.651 m)   Wt 214 lb (97.1 kg)   BMI 35.61 kg/m   Wt Readings from Last 3 Encounters:  02/07/24 195 lb 3.2 oz (88.5 kg)  08/09/23 202 lb (91.6 kg)  05/03/23 199 lb (90.3 kg)    General: Appears her stated age, obese, in NAD. Skin: 1 cm linear laceration noted of medial aspect of left thumb. Cardiovascular: Normal rate and rhythm.  Radial pulse 2+ on left.  Cap refill <3 seconds of left thumb. Pulmonary/Chest: Normal effort and positive vesicular breath sounds. No respiratory distress. No wheezes, rales or ronchi noted.  Musculoskeletal: Normal flexion extension of the left thumb.  No difficulty with gait.  Neurological: Alert and oriented.  Sensation intact to the left thumb.  BMET    Component Value Date/Time   NA 143 02/07/2024  0853   K 4.3 02/07/2024 0853   CL 104 02/07/2024 0853   CO2 30 02/07/2024 0853   GLUCOSE 85 02/07/2024 0853   BUN 12 02/07/2024 0853   CREATININE 0.77 02/07/2024 0853   CALCIUM 9.6 02/07/2024 0853   GFRNONAA >60 10/10/2018 1238   GFRAA >60 10/10/2018 1238    Lipid Panel     Component Value Date/Time   CHOL 155 02/07/2024 0853   TRIG 66 02/07/2024 0853   HDL 63 02/07/2024 0853   CHOLHDL 2.5 02/07/2024 0853   LDLCALC 77 02/07/2024 0853    CBC    Component Value Date/Time   WBC 7.4 02/07/2024 0853  RBC 5.32 (H) 02/07/2024 0853   HGB 14.6 02/07/2024 0853   HCT 45.2 (H) 02/07/2024 0853   PLT 236 02/07/2024 0853   MCV 85.0 02/07/2024 0853   MCH 27.4 02/07/2024 0853   MCHC 32.3 02/07/2024 0853   RDW 13.1 02/07/2024 0853    Hgb A1C Lab Results  Component Value Date   HGBA1C 6.1 (H) 02/07/2024            Assessment & Plan:   Assessment and Plan    Laceration of left thumb Laceration from a cat food lid. Bleeding controlled, no infection signs. Tetanus booster needed. - Administer tetanus booster today. - Apply Neosporin  and cover with Band-Aid in the morning. - Leave wound uncovered at night or cover without Neosporin . - Monitor for infection signs: redness, swelling, pus. - Avoid excessive moisture.     RTC in 1 month for your annual exam Angeline Laura, NP

## 2024-06-18 NOTE — Telephone Encounter (Signed)
 FYI Only or Action Required?: FYI only for provider.  Patient was last seen in primary care on 02/07/2024 by Antonette Angeline ORN, NP.  Called Nurse Triage reporting Laceration.  Symptoms began yesterday.  Interventions attempted: Nothing.  Symptoms are: unchanged.  Triage Disposition: See PCP When Office is Open (Within 3 Days)  Patient/caregiver understands and will follow disposition?: Yes, will follow disposition  Copied from CRM [id number]. Topic: Clinical - Red Word Triage >> Jun 18, 2024 11:59 AM Emylou G wrote: [facility] that prompted transfer to Nurse Triage: cut her thumb -- but still hasn't stopped bleeding since yesterday Reason for Disposition  [1] Last tetanus shot > 5 years ago AND [2] DIRTY cut  Answer Assessment - Initial Assessment Questions 1. APPEARANCE of INJURY: What does the injury look like?      Cut on cat food metal/tinfoil 2. ONSET: How long ago did the injury occur?      yesterday 3. LOCATION: Where is the injury located?      thumb 4. SIZE: How large is the cut?      0.25  5. BLEEDING: Is it bleeding now? If Yes, ask: Is it difficult to stop?      Bleeding controlled, pt states that she had wrapped the wound yesterday and the bleeding had stopped until today when she changed the bandage 6. PAIN: Is there any pain? If Yes, ask: How bad is the pain? (Scale 0-10; or none, mild, moderate, severe)     Only when touched 7. MECHANISM: Tell me how it happened.      Cut on cat food lid 8. TETANUS: When was your last tetanus booster?     Pt believes she is UTD  Protocols used: Cuts and Lacerations-A-AH

## 2024-08-03 ENCOUNTER — Other Ambulatory Visit: Payer: Self-pay | Admitting: Internal Medicine

## 2024-08-04 NOTE — Telephone Encounter (Signed)
 Requested Prescriptions  Pending Prescriptions Disp Refills   levothyroxine  (SYNTHROID ) 25 MCG tablet [Pharmacy Med Name: LEVOTHYROXINE  25 MCG TAB[*]] 90 tablet 1    Sig: TAKE ONE TABLET BY MOUTH ONE TIME DAILY     Endocrinology:  Hypothyroid Agents Passed - 08/04/2024  1:54 PM      Passed - TSH in normal range and within 360 days    TSH  Date Value Ref Range Status  02/07/2024 4.25 mIU/L Final    Comment:              Reference Range .           > or = 20 Years  0.40-4.50 .                Pregnancy Ranges           First trimester    0.26-2.66           Second trimester   0.55-2.73           Third trimester    0.43-2.91          Passed - Valid encounter within last 12 months    Recent Outpatient Visits           1 month ago Need for diphtheria-tetanus-pertussis (Tdap) vaccine   [facility] [place], Angeline ORN, NP   5 months ago Prediabetes   [facility] [place], Angeline ORN, [place]

## 2024-08-14 ENCOUNTER — Encounter: Payer: Self-pay | Admitting: Internal Medicine

## 2024-08-14 ENCOUNTER — Ambulatory Visit (INDEPENDENT_AMBULATORY_CARE_PROVIDER_SITE_OTHER): Admitting: Internal Medicine

## 2024-08-14 VITALS — BP 124/84 | Ht 65.0 in | Wt 214.8 lb

## 2024-08-14 DIAGNOSIS — E039 Hypothyroidism, unspecified: Secondary | ICD-10-CM

## 2024-08-14 DIAGNOSIS — Z1231 Encounter for screening mammogram for malignant neoplasm of breast: Secondary | ICD-10-CM

## 2024-08-14 DIAGNOSIS — R7303 Prediabetes: Secondary | ICD-10-CM | POA: Diagnosis not present

## 2024-08-14 DIAGNOSIS — E66812 Obesity, class 2: Secondary | ICD-10-CM

## 2024-08-14 DIAGNOSIS — Z23 Encounter for immunization: Secondary | ICD-10-CM

## 2024-08-14 DIAGNOSIS — Z0001 Encounter for general adult medical examination with abnormal findings: Secondary | ICD-10-CM | POA: Diagnosis not present

## 2024-08-14 DIAGNOSIS — R0982 Postnasal drip: Secondary | ICD-10-CM

## 2024-08-14 DIAGNOSIS — Z6835 Body mass index (BMI) 35.0-35.9, adult: Secondary | ICD-10-CM

## 2024-08-14 DIAGNOSIS — R051 Acute cough: Secondary | ICD-10-CM

## 2024-08-14 DIAGNOSIS — E782 Mixed hyperlipidemia: Secondary | ICD-10-CM

## 2024-08-14 MED ORDER — BENZONATATE 200 MG PO CAPS: 200.0000 mg | ORAL_CAPSULE | Freq: Two times a day (BID) | ORAL | 0 refills | Status: AC | PRN

## 2024-08-14 NOTE — Assessment & Plan Note (Signed)
 Encouraged diet and exercise for weight loss ?

## 2024-08-14 NOTE — Patient Instructions (Signed)
 Health Maintenance for Postmenopausal Women Menopause is a normal process in which your ability to get pregnant comes to an end. This process happens slowly over many months or years, usually between the ages of 76 and 38. Menopause is complete when you have missed your menstrual period for 12 months. It is important to talk with your health care provider about some of the most common conditions that affect women after menopause (postmenopausal women). These include heart disease, cancer, and bone loss (osteoporosis). Adopting a healthy lifestyle and getting preventive care can help to promote your health and wellness. The actions you take can also lower your chances of developing some of these common conditions. What are the signs and symptoms of menopause? During menopause, you may have the following symptoms: Hot flashes. These can be moderate or severe. Night sweats. Decrease in sex drive. Mood swings. Headaches. Tiredness (fatigue). Irritability. Memory problems. Problems falling asleep or staying asleep. Talk with your health care provider about treatment options for your symptoms. Do I need hormone replacement therapy? Hormone replacement therapy is effective in treating symptoms that are caused by menopause, such as hot flashes and night sweats. Hormone replacement carries certain risks, especially as you become older. If you are thinking about using estrogen or estrogen with progestin, discuss the benefits and risks with your health care provider. How can I reduce my risk for heart disease and stroke? The risk of heart disease, heart attack, and stroke increases as you age. One of the causes may be a change in the body's hormones during menopause. This can affect how your body uses dietary fats, triglycerides, and cholesterol. Heart attack and stroke are medical emergencies. There are many things that you can do to help prevent heart disease and stroke. Watch your blood pressure High  blood pressure causes heart disease and increases the risk of stroke. This is more likely to develop in people who have high blood pressure readings or are overweight. Have your blood pressure checked: Every 3-5 years if you are 32-23 years of age. Every year if you are 31 years old or older. Eat a healthy diet  Eat a diet that includes plenty of vegetables, fruits, low-fat dairy products, and lean protein. Do not eat a lot of foods that are high in solid fats, added sugars, or sodium. Get regular exercise Get regular exercise. This is one of the most important things you can do for your health. Most adults should: Try to exercise for at least 150 minutes each week. The exercise should increase your heart rate and make you sweat (moderate-intensity exercise). Try to do strengthening exercises at least twice each week. Do these in addition to the moderate-intensity exercise. Spend less time sitting. Even light physical activity can be beneficial. Other tips Work with your health care provider to achieve or maintain a healthy weight. Do not use any products that contain nicotine or tobacco. These products include cigarettes, chewing tobacco, and vaping devices, such as e-cigarettes. If you need help quitting, ask your health care provider. Know your numbers. Ask your health care provider to check your cholesterol and your blood sugar (glucose). Continue to have your blood tested as directed by your health care provider. Do I need screening for cancer? Depending on your health history and family history, you may need to have cancer screenings at different stages of your life. This may include screening for: Breast cancer. Cervical cancer. Lung cancer. Colorectal cancer. What is my risk for osteoporosis? After menopause, you may be  at increased risk for osteoporosis. Osteoporosis is a condition in which bone destruction happens more quickly than new bone creation. To help prevent osteoporosis or  the bone fractures that can happen because of osteoporosis, you may take the following actions: If you are 24-54 years old, get at least 1,000 mg of calcium and at least 600 international units (IU) of vitamin D  per day. If you are older than age 75 but younger than age 30, get at least 1,200 mg of calcium and at least 600 international units (IU) of vitamin D  per day. If you are older than age 8, get at least 1,200 mg of calcium and at least 800 international units (IU) of vitamin D  per day. Smoking and drinking excessive alcohol increase the risk of osteoporosis. Eat foods that are rich in calcium and vitamin D , and do weight-bearing exercises several times each week as directed by your health care provider. How does menopause affect my mental health? Depression may occur at any age, but it is more common as you become older. Common symptoms of depression include: Feeling depressed. Changes in sleep patterns. Changes in appetite or eating patterns. Feeling an overall lack of motivation or enjoyment of activities that you previously enjoyed. Frequent crying spells. Talk with your health care provider if you think that you are experiencing any of these symptoms. General instructions See your health care provider for regular wellness exams and vaccines. This may include: Scheduling regular health, dental, and eye exams. Getting and maintaining your vaccines. These include: Influenza vaccine. Get this vaccine each year before the flu season begins. Pneumonia vaccine. Shingles vaccine. Tetanus, diphtheria, and pertussis (Tdap) booster vaccine. Your health care provider may also recommend other immunizations. Tell your health care provider if you have ever been abused or do not feel safe at home. Summary Menopause is a normal process in which your ability to get pregnant comes to an end. This condition causes hot flashes, night sweats, decreased interest in sex, mood swings, headaches, or lack  of sleep. Treatment for this condition may include hormone replacement therapy. Take actions to keep yourself healthy, including exercising regularly, eating a healthy diet, watching your weight, and checking your blood pressure and blood sugar levels. Get screened for cancer and depression. Make sure that you are up to date with all your vaccines. This information is not intended to replace advice given to you by your health care provider. Make sure you discuss any questions you have with your health care provider. Document Revised: 03/20/2021 Document Reviewed: 03/20/2021 Elsevier Patient Education  2024 [employer].

## 2024-08-14 NOTE — Progress Notes (Signed)
 Subjective:    Patient ID: Dana Figueroa, female    DOB: November 27, 1975, 48 y.o.   MRN: [id number]  HPI  Patient presents to clinic today for her annual exam. She is concerned with weight gain and a persistent cough.  She has experienced significant weight gain, with her weight increasing to 214 pounds and a BMI of 35.74. Her weight previously fluctuated between 189 and 195 pounds over the past six months. She suspects the weight gain may be related to menopause, as she has not had a menstrual period all year. She questions whether her medication, hydroxychloroquine, could be contributing to the weight gain. Her diet includes scrambled eggs, fruit, Greek yogurt, sub sandwiches, chicken, and salad, but she does not count calories.  She has a persistent cough for the past few weeks, primarily occurring when she talks. No shortness of breath, reflux, or smoking history. She uses cough drops for temporary relief and has tried a nasal wash to help with post-nasal drip. No headache, ear pain, congestion, runny nose, sore throat or shortness of breath, but she feels post nasal drip. She takes omeprazole  daily and has started drinking more water due to the cough.    Flu: 07/2023 Tetanus: 06/2024 COVID: X 2 Pap smear: 03/2021 Mammogram: 09/2023 Colon screening: 01/2023, Cologuard Vision screening: annually Dentist: biannually  Diet: She does eat meat. She does consumes fruits and veggies. She tries to avoid fried foods. She drinks mostly water, soda, coffee Exercise: VR exercise, 3 x week   Review of Systems  Past Medical History:  Diagnosis Date   Headache    migraines   History of kidney stones    History of left breast biopsy    Hypothyroidism    Ovarian cyst    Scleroderma (HCC)     Current Outpatient Medications  Medication Sig Dispense Refill   amLODipine  (NORVASC ) 10 MG tablet TAKE ONE TABLET BY MOUTH ONE TIME DAILY 90 tablet 1   hydroxychloroquine (PLAQUENIL) 200 MG tablet  Take 200 mg by mouth 2 (two) times daily.     levothyroxine  (SYNTHROID ) 25 MCG tablet TAKE ONE TABLET BY MOUTH ONE TIME DAILY 90 tablet 1   omeprazole  (PRILOSEC) 20 MG capsule Take 1 capsule (20 mg total) by mouth daily. 90 capsule 1   simvastatin  (ZOCOR ) 10 MG tablet TAKE ONE TABLET BY MOUTH AT BEDTIME 90 tablet 1   No current facility-administered medications for this visit.    Allergies  Allergen Reactions   Benzoin Dermatitis, Itching and Other (See Comments)    compound benzoin tincture (USP)   Wound Dressing Adhesive Other (See Comments)    Skin tears   Codeine Rash   Steri-Strip Compound Benzoin [Benzoin] Itching, Rash and Other (See Comments)    Skin redness/skin blisters    Family History  Problem Relation Age of Onset   Hypertension Mother    Heart attack Mother    Cancer Maternal Grandfather     Social History   Socioeconomic History   Marital status: Single    Spouse name: Not on file   Number of children: Not on file   Years of education: Not on file   Highest education level: 12th grade  Occupational History   Occupation: Advertising account executive  Tobacco Use   Smoking status: Never   Smokeless tobacco: Never  Vaping Use   Vaping status: Never Used  Substance and Sexual Activity   Alcohol use: Never   Drug use: Never   Sexual activity: Not Currently  Birth control/protection: None  Other Topics Concern   Not on file  Social History Narrative   Not on file   Social Drivers of Health   Financial Resource Strain: Low Risk  (06/18/2024)   Overall Financial Resource Strain (CARDIA)    Difficulty of Paying Living Expenses: Not hard at all  Food Insecurity: No Food Insecurity (06/18/2024)   Hunger Vital Sign    Worried About Running Out of Food in the Last Year: Never true    Ran Out of Food in the Last Year: Never true  Transportation Needs: No Transportation Needs (06/18/2024)   PRAPARE - Administrator, Civil Service (Medical): No    Lack of  Transportation (Non-Medical): No  Physical Activity: Insufficiently Active (06/18/2024)   Exercise Vital Sign    Days of Exercise per Week: 4 days    Minutes of Exercise per Session: 30 min  Stress: No Stress Concern Present (06/18/2024)   [employer] of Occupational Health - Occupational Stress Questionnaire    Feeling of Stress: Only a little  Social Connections: Socially Isolated (06/18/2024)   Social Connection and Isolation Panel    Frequency of Communication with Friends and Family: Once a week    Frequency of Social Gatherings with Friends and Family: Once a week    Attends Religious Services: 1 to 4 times per year    Active Member of [employer] or Organizations: No    Attends Engineer, structural: Not on file    Marital Status: Divorced  Intimate Partner Violence: Not on file     Constitutional: Patient reports intermittent headaches.  Denies fever, malaise, fatigue, or abrupt weight changes.  HEENT: Pt reports post nasal drip. Denies eye pain, eye redness, ear pain, ringing in the ears, wax buildup, runny nose, nasal congestion, bloody nose, or sore throat. Respiratory: Pt reports cough. Denies difficulty breathing, shortness of breath, or sputum production.   Cardiovascular: Denies chest pain, chest tightness, palpitations or swelling in the hands or feet.  Gastrointestinal: Denies abdominal pain, bloating, constipation, diarrhea or blood in the stool.  GU: Denies urgency, frequency, pain with urination, burning sensation, blood in urine, odor or discharge. Musculoskeletal: Patient reports hip and back pain.  Denies decrease in range of motion, difficulty with gait, muscle pain or joint swelling.  Skin: Denies redness, rashes, lesions or ulcercations.  Neurological: Denies dizziness, difficulty with memory, difficulty with speech or problems with balance and coordination.  Psych: Denies anxiety, depression, SI/HI.  No other specific complaints in a complete review of  systems (except as listed in HPI above).     Objective:   Physical Exam BP 124/84 (BP Location: Left Arm, Patient Position: Sitting, Cuff Size: Normal)   Ht 5' 5 (1.651 m)   Wt 214 lb 12.8 oz (97.4 kg)   BMI 35.74 kg/m    Wt Readings from Last 3 Encounters:  06/18/24 214 lb (97.1 kg)  02/07/24 195 lb 3.2 oz (88.5 kg)  08/09/23 202 lb (91.6 kg)    General: Appears her stated age, obese, in NAD. Skin: Warm, dry and intact.  HEENT: Head: normal shape and size; Eyes: sclera white, no icterus, conjunctiva pink, PERRLA and EOMs intact; Nose: mucosa boggy and moist, turbinates swollen; Throat: mucosa pink and moist, +PND, no exudate or lesions noted. Neck:  Neck supple, trachea midline. No masses, lumps or thyromegaly present.  Cardiovascular: Normal rate and rhythm. S1,S2 noted.  No murmur, rubs or gallops noted. No JVD or BLE edema.  Pulmonary/Chest:  Normal effort and positive vesicular breath sounds. No respiratory distress. No wheezes, rales or ronchi noted.  Abdomen: Soft and nontender. Normal bowel sounds.  Musculoskeletal: Strength 5/5 BUE/BLE.  No difficulty with gait.  Neurological: Alert and oriented. Cranial nerves II-XII grossly intact. Coordination normal.  Psychiatric: Mood and affect normal. Behavior is normal. Judgment and thought content normal.    BMET    Component Value Date/Time   NA 143 02/07/2024 0853   K 4.3 02/07/2024 0853   CL 104 02/07/2024 0853   CO2 30 02/07/2024 0853   GLUCOSE 85 02/07/2024 0853   BUN 12 02/07/2024 0853   CREATININE 0.77 02/07/2024 0853   CALCIUM 9.6 02/07/2024 0853   GFRNONAA >60 10/10/2018 1238   GFRAA >60 10/10/2018 1238    Lipid Panel     Component Value Date/Time   CHOL 155 02/07/2024 0853   TRIG 66 02/07/2024 0853   HDL 63 02/07/2024 0853   CHOLHDL 2.5 02/07/2024 0853   LDLCALC 77 02/07/2024 0853    CBC    Component Value Date/Time   WBC 7.4 02/07/2024 0853   RBC 5.32 (H) 02/07/2024 0853   HGB 14.6 02/07/2024  0853   HCT 45.2 (H) 02/07/2024 0853   PLT 236 02/07/2024 0853   MCV 85.0 02/07/2024 0853   MCH 27.4 02/07/2024 0853   MCHC 32.3 02/07/2024 0853   RDW 13.1 02/07/2024 0853    Hgb A1C Lab Results  Component Value Date   HGBA1C 6.1 (H) 02/07/2024            Assessment & Plan:   Preventative health maintenance:  Flu shot today Tetanus UTD Encouraged her to get her COVID booster Pap smear UTD Mammogram ordered-she will call to schedule Colon screening UTD Encouraged her to consume a balanced diet and exercise regimen Advised her to see an eye doctor and dentist annually Will check CBC, c-Met, TSH, free T4, lipid, A1c today   Allergic rhinitis with postnasal drip and chronic cough Chronic cough due to allergic rhinitis with postnasal drip. No SOB or reflux. Likely allergy-related. - Zyrtec  10 mg BID for 3 days, then daily for 7-10 days. - Tessalon 200 mg q8h PRN for cough. - Use cough drops PRN. - Start Zyrtec  at onset of cold weather in future.     RTC in 6 months, follow-up chronic conditions Angeline Laura, NP

## 2024-08-15 LAB — CBC
HCT: 46.5 % — ABNORMAL HIGH (ref 35.0–45.0)
Hemoglobin: 14.9 g/dL (ref 11.7–15.5)
MCH: 27.7 pg (ref 27.0–33.0)
MCHC: 32 g/dL (ref 32.0–36.0)
MCV: 86.6 fL (ref 80.0–100.0)
MPV: 10.9 fL (ref 7.5–12.5)
Platelets: 218 Thousand/uL (ref 140–400)
RBC: 5.37 Million/uL — ABNORMAL HIGH (ref 3.80–5.10)
RDW: 13 % (ref 11.0–15.0)
WBC: 6.6 Thousand/uL (ref 3.8–10.8)

## 2024-08-15 LAB — COMPREHENSIVE METABOLIC PANEL WITH GFR
AG Ratio: 1.8 (calc) (ref 1.0–2.5)
ALT: 20 U/L (ref 6–29)
AST: 20 U/L (ref 10–35)
Albumin: 4.6 g/dL (ref 3.6–5.1)
Alkaline phosphatase (APISO): 91 U/L (ref 37–153)
BUN: 11 mg/dL (ref 7–25)
CO2: 29 mmol/L (ref 20–32)
Calcium: 9.7 mg/dL (ref 8.6–10.4)
Chloride: 105 mmol/L (ref 98–110)
Creat: 0.73 mg/dL (ref 0.50–1.03)
Globulin: 2.6 g/dL (ref 1.9–3.7)
Glucose, Bld: 108 mg/dL — ABNORMAL HIGH (ref 65–99)
Potassium: 4.4 mmol/L (ref 3.5–5.3)
Sodium: 142 mmol/L (ref 135–146)
Total Bilirubin: 0.5 mg/dL (ref 0.2–1.2)
Total Protein: 7.2 g/dL (ref 6.1–8.1)
eGFR: 100 mL/min/1.73m2 (ref >=60)

## 2024-08-15 LAB — LIPID PANEL
Cholesterol: 145 mg/dL (ref <200)
HDL: 55 mg/dL (ref >=50)
LDL Cholesterol (Calc): 72 mg/dL
Non-HDL Cholesterol (Calc): 90 mg/dL (ref <130)
Total CHOL/HDL Ratio: 2.6 (calc) (ref <5.0)
Triglycerides: 97 mg/dL (ref <150)

## 2024-08-15 LAB — HEMOGLOBIN A1C
Hgb A1c MFr Bld: 6 % — ABNORMAL HIGH (ref <5.7)
Mean Plasma Glucose: 126 mg/dL
eAG (mmol/L): 7 mmol/L

## 2024-08-15 LAB — T4, FREE: Free T4: 1.1 ng/dL (ref 0.8–1.8)

## 2024-08-15 LAB — TSH: TSH: 3.75 m[IU]/L

## 2024-08-17 ENCOUNTER — Ambulatory Visit: Payer: Self-pay | Admitting: Internal Medicine

## 2024-10-06 ENCOUNTER — Other Ambulatory Visit: Payer: Self-pay | Admitting: Internal Medicine

## 2024-10-06 DIAGNOSIS — Z0001 Encounter for general adult medical examination with abnormal findings: Secondary | ICD-10-CM

## 2024-10-06 NOTE — Telephone Encounter (Signed)
 Requested Prescriptions  Pending Prescriptions Disp Refills   omeprazole  (PRILOSEC) 20 MG capsule [Pharmacy Med Name: OMEPRAZOLE  20 MG CAP[*]] 90 capsule 3    Sig: TAKE ONE CAPSULE BY MOUTH ONE TIME DAILY     Gastroenterology: Proton Pump Inhibitors Passed - 10/06/2024  5:11 PM      Passed - Valid encounter within last 12 months    Recent Outpatient Visits           1 month ago Encounter for general adult medical examination with abnormal findings   [facility] [place], Angeline ORN, NP   3 months ago Need for diphtheria-tetanus-pertussis (Tdap) vaccine   [facility] [place], Angeline ORN, NP   8 months ago Prediabetes   [facility] [place], [place] W, NP               simvastatin  (ZOCOR ) 10 MG tablet [Pharmacy Med Name: SIMVASTATIN  10 MG TAB[*]] 90 tablet 3    Sig: TAKE ONE TABLET BY MOUTH AT BEDTIME     Cardiovascular:  Antilipid - Statins Failed - 10/06/2024  5:11 PM      Failed - Lipid Panel in normal range within the last 12 months    Cholesterol  Date Value Ref Range Status  08/14/2024 145 <200 mg/dL Final   LDL Cholesterol (Calc)  Date Value Ref Range Status  08/14/2024 72 mg/dL (calc) Final    Comment:    Reference range: <100 . Desirable range <100 mg/dL for primary prevention;   <70 mg/dL for patients with CHD or diabetic patients  with > or = 2 CHD risk factors. SABRA LDL-C is now calculated using the Martin-Hopkins  calculation, which is a validated novel method providing  better accuracy than the Friedewald equation in the  estimation of LDL-C.  Gladis APPLETHWAITE et al. SANDREA. 7986;689(80): 2061-2068  (http://education.[facility].com/faq/FAQ164)    HDL  Date Value Ref Range Status  08/14/2024 55 > OR = 50 mg/dL Final   Triglycerides  Date Value Ref Range Status  08/14/2024 97 <150 mg/dL Final         Passed - Patient is not pregnant      Passed - Valid encounter within last 12  months    Recent Outpatient Visits           1 month ago Encounter for general adult medical examination with abnormal findings   [facility] [place], Angeline ORN, NP   3 months ago Need for diphtheria-tetanus-pertussis (Tdap) vaccine   [facility] [place], Angeline ORN, NP   8 months ago Prediabetes   [facility] [place], Angeline ORN, [place]

## 2024-11-05 ENCOUNTER — Other Ambulatory Visit: Payer: Self-pay | Admitting: Internal Medicine

## 2024-11-06 NOTE — Telephone Encounter (Signed)
 Requested Prescriptions  Pending Prescriptions Disp Refills   amLODipine  (NORVASC ) 10 MG tablet [Pharmacy Med Name: AMLODIPINE  10 MG TAB] 90 tablet 1    Sig: TAKE ONE TABLET BY MOUTH ONE TIME DAILY     Cardiovascular: Calcium Channel Blockers 2 Passed - 11/06/2024  6:35 PM      Passed - Last BP in normal range    BP Readings from Last 1 Encounters:  08/14/24 124/84         Passed - Last Heart Rate in normal range    Pulse Readings from Last 1 Encounters:  02/18/24 68         Passed - Valid encounter within last 6 months    Recent Outpatient Visits           2 months ago Encounter for general adult medical examination with abnormal findings   [facility] [place], Angeline ORN, NP   4 months ago Need for diphtheria-tetanus-pertussis (Tdap) vaccine   [facility] [place], Angeline ORN, NP   9 months ago Prediabetes   [facility] [place], Angeline ORN, [place]

## 2024-12-04 ENCOUNTER — Ambulatory Visit
Admission: RE | Admit: 2024-12-04 | Discharge: 2024-12-04 | Disposition: A | Source: Ambulatory Visit | Attending: Internal Medicine | Admitting: Internal Medicine

## 2024-12-04 DIAGNOSIS — Z1231 Encounter for screening mammogram for malignant neoplasm of breast: Secondary | ICD-10-CM | POA: Diagnosis present

## 2024-12-08 ENCOUNTER — Other Ambulatory Visit: Payer: Self-pay | Admitting: Internal Medicine

## 2024-12-08 DIAGNOSIS — N6489 Other specified disorders of breast: Secondary | ICD-10-CM

## 2024-12-08 DIAGNOSIS — R928 Other abnormal and inconclusive findings on diagnostic imaging of breast: Secondary | ICD-10-CM

## 2024-12-09 ENCOUNTER — Inpatient Hospital Stay: Admission: RE | Admit: 2024-12-09 | Discharge: 2024-12-09 | Attending: Internal Medicine | Admitting: Internal Medicine

## 2024-12-09 ENCOUNTER — Other Ambulatory Visit: Payer: Self-pay | Admitting: Internal Medicine

## 2024-12-09 ENCOUNTER — Ambulatory Visit
Admission: RE | Admit: 2024-12-09 | Discharge: 2024-12-09 | Disposition: A | Discharge status: Home / Self Care | Source: Ambulatory Visit | Attending: Internal Medicine | Admitting: Internal Medicine

## 2024-12-09 DIAGNOSIS — N6489 Other specified disorders of breast: Secondary | ICD-10-CM | POA: Insufficient documentation

## 2024-12-09 DIAGNOSIS — R928 Other abnormal and inconclusive findings on diagnostic imaging of breast: Secondary | ICD-10-CM

## 2024-12-14 ENCOUNTER — Inpatient Hospital Stay
Admission: RE | Admit: 2024-12-14 | Discharge: 2024-12-14 | Disposition: A | Source: Ambulatory Visit | Attending: Internal Medicine | Admitting: Internal Medicine

## 2024-12-14 ENCOUNTER — Ambulatory Visit
Admission: RE | Admit: 2024-12-14 | Discharge: 2024-12-14 | Disposition: A | Source: Ambulatory Visit | Attending: Internal Medicine | Admitting: Internal Medicine

## 2024-12-14 DIAGNOSIS — R928 Other abnormal and inconclusive findings on diagnostic imaging of breast: Secondary | ICD-10-CM

## 2024-12-14 MED ORDER — LIDOCAINE 1 % OPTIME INJ - NO CHARGE
2.0000 mL | Freq: Once | INTRAMUSCULAR | Status: AC
  Administered 2024-12-14: 2 mL
  Filled 2024-12-14: qty 2

## 2024-12-14 MED ORDER — LIDOCAINE-EPINEPHRINE 1 %-1:100000 IJ SOLN
5.0000 mL | Freq: Once | INTRAMUSCULAR | Status: AC
  Administered 2024-12-14: 5 mL
  Filled 2024-12-14: qty 10

## 2024-12-16 LAB — SURGICAL PATHOLOGY

## 2025-02-19 ENCOUNTER — Ambulatory Visit: Admitting: Internal Medicine
